# Patient Record
Sex: Female | Born: 1945 | Race: White | Hispanic: No | Smoking: Former smoker
Health system: Southern US, Community
[De-identification: ages and names within clinical notes are randomized; demographics above are authoritative.]

## PROBLEM LIST (undated history)

## (undated) DIAGNOSIS — E7800 Pure hypercholesterolemia, unspecified: Secondary | ICD-10-CM

## (undated) DIAGNOSIS — R350 Frequency of micturition: Secondary | ICD-10-CM

## (undated) DIAGNOSIS — M797 Fibromyalgia: Secondary | ICD-10-CM

## (undated) DIAGNOSIS — Z923 Personal history of irradiation: Secondary | ICD-10-CM

## (undated) DIAGNOSIS — K219 Gastro-esophageal reflux disease without esophagitis: Secondary | ICD-10-CM

## (undated) DIAGNOSIS — E119 Type 2 diabetes mellitus without complications: Secondary | ICD-10-CM

## (undated) DIAGNOSIS — F419 Anxiety disorder, unspecified: Secondary | ICD-10-CM

## (undated) DIAGNOSIS — C50919 Malignant neoplasm of unspecified site of unspecified female breast: Secondary | ICD-10-CM

## (undated) DIAGNOSIS — G4730 Sleep apnea, unspecified: Secondary | ICD-10-CM

## (undated) DIAGNOSIS — I10 Essential (primary) hypertension: Secondary | ICD-10-CM

## (undated) DIAGNOSIS — M7630 Iliotibial band syndrome, unspecified leg: Secondary | ICD-10-CM

## (undated) HISTORY — DX: Gastro-esophageal reflux disease without esophagitis: K21.9

## (undated) HISTORY — DX: Sleep apnea, unspecified: G47.30

## (undated) HISTORY — DX: Malignant neoplasm of unspecified site of unspecified female breast: C50.919

## (undated) HISTORY — DX: Frequency of micturition: R35.0

## (undated) HISTORY — DX: Essential (primary) hypertension: I10

## (undated) HISTORY — DX: Iliotibial band syndrome, unspecified leg: M76.30

## (undated) HISTORY — PX: OTHER SURGICAL HISTORY: SHX169

## (undated) HISTORY — DX: Pure hypercholesterolemia, unspecified: E78.00

## (undated) HISTORY — DX: Type 2 diabetes mellitus without complications: E11.9

## (undated) HISTORY — DX: Anxiety disorder, unspecified: F41.9

## (undated) HISTORY — PX: CARPAL TUNNEL RELEASE: SHX101

## (undated) HISTORY — DX: Fibromyalgia: M79.7

## (undated) NOTE — *Deleted (*Deleted)
Patient Care Team: Lauro Regulus, MD as PCP - General (Internal Medicine)  DIAGNOSIS: No diagnosis found.  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  Progesterone receptor positive.  HER-2/neu receptor negative by FISH. 2. Status post right partial Mastectomy.   3. Oncotype DX score is 16 week chances of recurrent disease in 10 years is 10% with tamoxifen 4. Patient will start radiation therapy followed by an aromatase inhibitor. 5. Started Letrozole, October 18, 2013   Carcinoma of overlapping sites of right breast in female, estrogen receptor positive (HCC)  05/13/2013 Initial Biopsy   Right breast biopsy: Invasive mammary cancer, stage Ic, T1 cN0, ER positive PR positive HER-2 negative, status post right mastectomy, Oncotype DX score 16: 10% RRR with tamoxifen, adjuvant radiation therapy [facility]- seen by Dr.Choksi)   10/18/2013 -  Anti-estrogen oral therapy   Letrozole 2.5 mg daily stopped April 2017.  Restarted July 2019     CHIEF COMPLIANT: Follow-up of right breast cancer on letrozole  INTERVAL HISTORY: Gabriella [name] is a 59 y.o. with above-mentioned history of right breast cancer treated with lumpectomy, radiation, and who is currently on letrozole. She presents to the clinic today for annual follow-up.   ALLERGIES:  is allergic to biaxin [clarithromycin], codeine, eggs or egg-derived products, keflex [cephalexin], morphine and related, penicillins, and sulfa antibiotics.  MEDICATIONS:  Current Outpatient Medications  Medication Sig Dispense Refill  . amitriptyline (ELAVIL) 50 MG tablet Take 1 tablet (50 mg total) by mouth at bedtime. 30 tablet 0  . letrozole (FEMARA) 2.5 MG tablet TAKE ONE TABLET BY MOUTH DAILY 90 tablet 0  . metFORMIN (GLUCOPHAGE) 500 MG tablet Take 500 mg by mouth 2 (two) times  daily with a meal.    . metoprolol tartrate (LOPRESSOR) 25 MG tablet Take 25 mg by mouth daily.    . mometasone (NASONEX) 50 MCG/ACT nasal spray Place 2 sprays into the nose daily.    . pravastatin (PRAVACHOL) 20 MG tablet Take 20 mg by mouth daily.     No current facility-administered medications for this visit.    PHYSICAL EXAMINATION: ECOG PERFORMANCE STATUS: {CHL ONC ECOG [id number]}  There were no vitals filed for this visit. There were no vitals filed for this visit.  BREAST:*** No palpable masses or nodules in either right or left breasts. No palpable axillary supraclavicular or infraclavicular adenopathy no breast tenderness or nipple discharge. (exam performed in the presence of a chaperone)  LABORATORY DATA:  I have reviewed the data as listed CMP Latest Ref Rng & Units 07/09/2019 07/06/2015 01/24/2014  Glucose 65 - 99 mg/dL - 409(W) 119(J)  BUN 6 - 20 mg/dL - 9 13  Creatinine 4.78 - 1.00 mg/dL 2.95 6.21 3.08  Sodium 135 - 145 mmol/L - 133(L) 139  Potassium 3.5 - 5.1 mmol/L - 3.3(L) 3.9  Chloride 101 - 111 mmol/L - 99(L) 102  CO2 22 - 32 mmol/L - 29 30  Calcium 8.9 - 10.3 mg/dL - 9.2 9.0  Total Protein 6.5 - 8.1 g/dL - 7.6 -  Total Bilirubin 0.3 - 1.2 mg/dL - 0.5 -  Alkaline Phos 38 - 126 U/L - 92 -  AST 15 - 41 U/L - 18 -  ALT 14 - 54 U/L - 15 -    Lab Results  Component Value Date   WBC 11.7 (H) 07/06/2015  HGB 12.6 07/06/2015   HCT 37.4 07/06/2015   MCV 86.0 07/06/2015   PLT 348 07/06/2015   NEUTROABS 6.8 (H) 07/06/2015    ASSESSMENT & PLAN:  No problem-specific Assessment & Plan notes found for this encounter.    No orders of the defined types were placed in this encounter.  The patient has a good understanding of the overall plan. she agrees with it. she will call with any problems that may develop before the next visit here.  Total time spent: *** mins including face to face time and time spent for planning, charting and coordination of care   Serena Croissant, MD 04/04/2020  I, Kirt Boys Dorshimer, am acting as scribe for Dr. Serena Croissant.  {insert scribe attestation}

---

## 1997-06-24 HISTORY — PX: OTHER SURGICAL HISTORY: SHX169

## 1997-06-24 HISTORY — PX: CHOLECYSTECTOMY: SHX55

## 1998-06-24 HISTORY — PX: HERNIA REPAIR: SHX51

## 2003-05-26 ENCOUNTER — Ambulatory Visit (HOSPITAL_COMMUNITY): Admission: RE | Admit: 2003-05-26 | Discharge: 2003-05-27 | Payer: Self-pay | Admitting: Ophthalmology

## 2006-05-22 ENCOUNTER — Ambulatory Visit: Payer: Self-pay | Admitting: Obstetrics and Gynecology

## 2007-06-08 ENCOUNTER — Ambulatory Visit: Payer: Self-pay | Admitting: Obstetrics and Gynecology

## 2009-04-24 ENCOUNTER — Ambulatory Visit: Payer: Self-pay | Admitting: Cardiology

## 2009-04-24 ENCOUNTER — Ambulatory Visit: Payer: Self-pay | Admitting: Unknown Physician Specialty

## 2009-05-01 ENCOUNTER — Ambulatory Visit: Payer: Self-pay | Admitting: Family Medicine

## 2009-05-09 ENCOUNTER — Ambulatory Visit: Payer: Self-pay | Admitting: Unknown Physician Specialty

## 2009-05-23 ENCOUNTER — Ambulatory Visit: Payer: Self-pay | Admitting: Unknown Physician Specialty

## 2010-08-28 ENCOUNTER — Ambulatory Visit: Payer: Self-pay | Admitting: Unknown Physician Specialty

## 2011-03-17 ENCOUNTER — Emergency Department: Payer: Self-pay | Admitting: Internal Medicine

## 2012-06-24 DIAGNOSIS — C50919 Malignant neoplasm of unspecified site of unspecified female breast: Secondary | ICD-10-CM

## 2012-06-24 DIAGNOSIS — Z923 Personal history of irradiation: Secondary | ICD-10-CM

## 2012-06-24 HISTORY — PX: BREAST LUMPECTOMY: SHX2

## 2012-06-24 HISTORY — PX: CATARACT EXTRACTION: SUR2

## 2012-06-24 HISTORY — DX: Malignant neoplasm of unspecified site of unspecified female breast: C50.919

## 2012-06-24 HISTORY — DX: Personal history of irradiation: Z92.3

## 2013-03-18 LAB — HM PAP SMEAR: HM Pap smear: NEGATIVE

## 2013-05-04 ENCOUNTER — Ambulatory Visit: Payer: Self-pay | Admitting: Obstetrics and Gynecology

## 2013-05-04 LAB — HM DEXA SCAN

## 2013-05-13 ENCOUNTER — Ambulatory Visit: Payer: Self-pay | Admitting: Obstetrics and Gynecology

## 2013-05-19 LAB — PATHOLOGY REPORT

## 2013-05-27 ENCOUNTER — Ambulatory Visit: Payer: Self-pay | Admitting: Surgery

## 2013-05-27 LAB — CBC WITH DIFFERENTIAL/PLATELET
Basophil #: 0 10*3/uL (ref 0.0–0.1)
Basophil %: 0.5 %
Eosinophil #: 0.2 10*3/uL (ref 0.0–0.7)
Eosinophil %: 2.2 %
HCT: 34.7 % — ABNORMAL LOW (ref 35.0–47.0)
HGB: 11.7 g/dL — ABNORMAL LOW (ref 12.0–16.0)
Lymphocyte #: 3.6 10*3/uL (ref 1.0–3.6)
Lymphocyte %: 38.5 %
MCH: 29.2 pg (ref 26.0–34.0)
MCHC: 33.8 g/dL (ref 32.0–36.0)
MCV: 87 fL (ref 80–100)
Monocyte #: 0.8 x10 3/mm (ref 0.2–0.9)
Monocyte %: 8.9 %
Neutrophil #: 4.7 10*3/uL (ref 1.4–6.5)
Neutrophil %: 49.9 %
Platelet: 301 10*3/uL (ref 150–440)
RBC: 4.01 10*6/uL (ref 3.80–5.20)
RDW: 14.2 % (ref 11.5–14.5)
WBC: 9.4 10*3/uL (ref 3.6–11.0)

## 2013-06-01 ENCOUNTER — Ambulatory Visit: Payer: Self-pay | Admitting: Oncology

## 2013-06-03 ENCOUNTER — Ambulatory Visit: Payer: Self-pay | Admitting: Surgery

## 2013-06-09 LAB — PATHOLOGY REPORT

## 2013-06-24 ENCOUNTER — Ambulatory Visit: Payer: Self-pay | Admitting: Oncology

## 2013-06-25 ENCOUNTER — Ambulatory Visit: Payer: Self-pay | Admitting: Oncology

## 2013-07-25 ENCOUNTER — Ambulatory Visit: Payer: Self-pay | Admitting: Oncology

## 2013-08-09 LAB — CBC CANCER CENTER
Basophil #: 0.1 x10 3/mm (ref 0.0–0.1)
Basophil %: 1 %
Eosinophil #: 0.2 x10 3/mm (ref 0.0–0.7)
Eosinophil %: 2.3 %
HCT: 35.9 % (ref 35.0–47.0)
HGB: 11.9 g/dL — ABNORMAL LOW (ref 12.0–16.0)
Lymphocyte #: 2.4 x10 3/mm (ref 1.0–3.6)
Lymphocyte %: 30.3 %
MCH: 29 pg (ref 26.0–34.0)
MCHC: 33.1 g/dL (ref 32.0–36.0)
MCV: 88 fL (ref 80–100)
Monocyte #: 0.8 x10 3/mm (ref 0.2–0.9)
Monocyte %: 10.3 %
Neutrophil #: 4.4 x10 3/mm (ref 1.4–6.5)
Neutrophil %: 56.1 %
Platelet: 318 x10 3/mm (ref 150–440)
RBC: 4.09 10*6/uL (ref 3.80–5.20)
RDW: 13.9 % (ref 11.5–14.5)
WBC: 7.8 x10 3/mm (ref 3.6–11.0)

## 2013-08-16 LAB — CBC CANCER CENTER
Basophil #: 0.1 x10 3/mm (ref 0.0–0.1)
Basophil %: 0.9 %
Eosinophil #: 0.2 x10 3/mm (ref 0.0–0.7)
Eosinophil %: 2.3 %
HCT: 36 % (ref 35.0–47.0)
HGB: 12 g/dL (ref 12.0–16.0)
Lymphocyte #: 2.3 x10 3/mm (ref 1.0–3.6)
Lymphocyte %: 29.2 %
MCH: 29 pg (ref 26.0–34.0)
MCHC: 33.2 g/dL (ref 32.0–36.0)
MCV: 87 fL (ref 80–100)
Monocyte #: 0.8 x10 3/mm (ref 0.2–0.9)
Monocyte %: 10.1 %
Neutrophil #: 4.5 x10 3/mm (ref 1.4–6.5)
Neutrophil %: 57.5 %
Platelet: 307 x10 3/mm (ref 150–440)
RBC: 4.12 10*6/uL (ref 3.80–5.20)
RDW: 13.7 % (ref 11.5–14.5)
WBC: 7.9 x10 3/mm (ref 3.6–11.0)

## 2013-08-22 ENCOUNTER — Ambulatory Visit: Payer: Self-pay | Admitting: Oncology

## 2013-08-23 LAB — CBC CANCER CENTER
Basophil #: 0.1 x10 3/mm (ref 0.0–0.1)
Basophil %: 0.9 %
Eosinophil #: 0.2 x10 3/mm (ref 0.0–0.7)
Eosinophil %: 3.4 %
HCT: 36.5 % (ref 35.0–47.0)
HGB: 11.9 g/dL — ABNORMAL LOW (ref 12.0–16.0)
Lymphocyte #: 2.5 x10 3/mm (ref 1.0–3.6)
Lymphocyte %: 34.3 %
MCH: 28.6 pg (ref 26.0–34.0)
MCHC: 32.6 g/dL (ref 32.0–36.0)
MCV: 88 fL (ref 80–100)
Monocyte #: 0.8 x10 3/mm (ref 0.2–0.9)
Monocyte %: 10.6 %
Neutrophil #: 3.7 x10 3/mm (ref 1.4–6.5)
Neutrophil %: 50.8 %
Platelet: 309 x10 3/mm (ref 150–440)
RBC: 4.16 10*6/uL (ref 3.80–5.20)
RDW: 14.3 % (ref 11.5–14.5)
WBC: 7.3 x10 3/mm (ref 3.6–11.0)

## 2013-08-31 LAB — CBC CANCER CENTER
Basophil #: 0.1 x10 3/mm (ref 0.0–0.1)
Basophil %: 0.9 %
Eosinophil #: 0.2 x10 3/mm (ref 0.0–0.7)
Eosinophil %: 2.5 %
HCT: 35.7 % (ref 35.0–47.0)
HGB: 11.8 g/dL — ABNORMAL LOW (ref 12.0–16.0)
Lymphocyte #: 2.2 x10 3/mm (ref 1.0–3.6)
Lymphocyte %: 25.2 %
MCH: 28.9 pg (ref 26.0–34.0)
MCHC: 33 g/dL (ref 32.0–36.0)
MCV: 88 fL (ref 80–100)
Monocyte #: 0.9 x10 3/mm (ref 0.2–0.9)
Monocyte %: 10.6 %
Neutrophil #: 5.2 x10 3/mm (ref 1.4–6.5)
Neutrophil %: 60.8 %
Platelet: 277 x10 3/mm (ref 150–440)
RBC: 4.07 10*6/uL (ref 3.80–5.20)
RDW: 14 % (ref 11.5–14.5)
WBC: 8.6 x10 3/mm (ref 3.6–11.0)

## 2013-09-06 LAB — CBC CANCER CENTER
Basophil #: 0.1 x10 3/mm (ref 0.0–0.1)
Basophil %: 1.1 %
Eosinophil #: 0.2 x10 3/mm (ref 0.0–0.7)
Eosinophil %: 3 %
HCT: 35.7 % (ref 35.0–47.0)
HGB: 11.7 g/dL — ABNORMAL LOW (ref 12.0–16.0)
Lymphocyte #: 1.8 x10 3/mm (ref 1.0–3.6)
Lymphocyte %: 27.2 %
MCH: 28.8 pg (ref 26.0–34.0)
MCHC: 32.9 g/dL (ref 32.0–36.0)
MCV: 88 fL (ref 80–100)
Monocyte #: 0.8 x10 3/mm (ref 0.2–0.9)
Monocyte %: 12.4 %
Neutrophil #: 3.8 x10 3/mm (ref 1.4–6.5)
Neutrophil %: 56.3 %
Platelet: 283 x10 3/mm (ref 150–440)
RBC: 4.07 10*6/uL (ref 3.80–5.20)
RDW: 14.2 % (ref 11.5–14.5)
WBC: 6.8 x10 3/mm (ref 3.6–11.0)

## 2013-09-13 LAB — CBC CANCER CENTER
Basophil #: 0.1 x10 3/mm (ref 0.0–0.1)
Basophil %: 0.9 %
Eosinophil #: 0.2 x10 3/mm (ref 0.0–0.7)
Eosinophil %: 2.4 %
HCT: 35 % (ref 35.0–47.0)
HGB: 11.6 g/dL — ABNORMAL LOW (ref 12.0–16.0)
Lymphocyte #: 1.9 x10 3/mm (ref 1.0–3.6)
Lymphocyte %: 24 %
MCH: 28.9 pg (ref 26.0–34.0)
MCHC: 33.1 g/dL (ref 32.0–36.0)
MCV: 87 fL (ref 80–100)
Monocyte #: 0.8 x10 3/mm (ref 0.2–0.9)
Monocyte %: 10 %
Neutrophil #: 4.9 x10 3/mm (ref 1.4–6.5)
Neutrophil %: 62.7 %
Platelet: 283 x10 3/mm (ref 150–440)
RBC: 4.01 10*6/uL (ref 3.80–5.20)
RDW: 14.1 % (ref 11.5–14.5)
WBC: 7.7 x10 3/mm (ref 3.6–11.0)

## 2013-09-22 ENCOUNTER — Ambulatory Visit: Payer: Self-pay | Admitting: Oncology

## 2013-10-18 LAB — CBC CANCER CENTER
Basophil #: 0.1 x10 3/mm (ref 0.0–0.1)
Basophil %: 0.8 %
Eosinophil #: 0.2 x10 3/mm (ref 0.0–0.7)
Eosinophil %: 2.6 %
HCT: 36.7 % (ref 35.0–47.0)
HGB: 12.2 g/dL (ref 12.0–16.0)
Lymphocyte #: 2.1 x10 3/mm (ref 1.0–3.6)
Lymphocyte %: 27.7 %
MCH: 29.2 pg (ref 26.0–34.0)
MCHC: 33.4 g/dL (ref 32.0–36.0)
MCV: 88 fL (ref 80–100)
Monocyte #: 0.8 x10 3/mm (ref 0.2–0.9)
Monocyte %: 10.3 %
Neutrophil #: 4.4 x10 3/mm (ref 1.4–6.5)
Neutrophil %: 58.6 %
Platelet: 319 x10 3/mm (ref 150–440)
RBC: 4.19 10*6/uL (ref 3.80–5.20)
RDW: 13.9 % (ref 11.5–14.5)
WBC: 7.4 x10 3/mm (ref 3.6–11.0)

## 2013-10-18 LAB — COMPREHENSIVE METABOLIC PANEL
Albumin: 3.3 g/dL — ABNORMAL LOW (ref 3.4–5.0)
Alkaline Phosphatase: 102 U/L
Anion Gap: 10 (ref 7–16)
BUN: 11 mg/dL (ref 7–18)
Bilirubin,Total: 0.3 mg/dL (ref 0.2–1.0)
Calcium, Total: 9.8 mg/dL (ref 8.5–10.1)
Chloride: 103 mmol/L (ref 98–107)
Co2: 29 mmol/L (ref 21–32)
Creatinine: 0.89 mg/dL (ref 0.60–1.30)
EGFR (African American): >60
EGFR (Non-African Amer.): >60
Glucose: 155 mg/dL — ABNORMAL HIGH (ref 65–99)
Osmolality: 286 (ref 275–301)
Potassium: 4.3 mmol/L (ref 3.5–5.1)
SGOT(AST): 20 U/L (ref 15–37)
SGPT (ALT): 25 U/L (ref 12–78)
Sodium: 142 mmol/L (ref 136–145)
Total Protein: 7.5 g/dL (ref 6.4–8.2)

## 2013-10-22 ENCOUNTER — Ambulatory Visit: Payer: Self-pay | Admitting: Oncology

## 2013-11-24 ENCOUNTER — Ambulatory Visit: Payer: Self-pay | Admitting: Oncology

## 2014-01-24 ENCOUNTER — Ambulatory Visit: Payer: Self-pay | Admitting: Oncology

## 2014-01-24 LAB — CBC CANCER CENTER
Basophil #: 0.1 x10 3/mm (ref 0.0–0.1)
Basophil %: 0.7 %
Eosinophil #: 0.1 x10 3/mm (ref 0.0–0.7)
Eosinophil %: 1.7 %
HCT: 35.1 % (ref 35.0–47.0)
HGB: 11.7 g/dL — ABNORMAL LOW (ref 12.0–16.0)
Lymphocyte #: 1.9 x10 3/mm (ref 1.0–3.6)
Lymphocyte %: 23.4 %
MCH: 29.6 pg (ref 26.0–34.0)
MCHC: 33.3 g/dL (ref 32.0–36.0)
MCV: 89 fL (ref 80–100)
Monocyte #: 0.7 x10 3/mm (ref 0.2–0.9)
Monocyte %: 8.9 %
Neutrophil #: 5.2 x10 3/mm (ref 1.4–6.5)
Neutrophil %: 65.3 %
Platelet: 279 x10 3/mm (ref 150–440)
RBC: 3.96 10*6/uL (ref 3.80–5.20)
RDW: 13.7 % (ref 11.5–14.5)
WBC: 7.9 x10 3/mm (ref 3.6–11.0)

## 2014-01-24 LAB — BASIC METABOLIC PANEL
Anion Gap: 7 (ref 7–16)
BUN: 13 mg/dL (ref 7–18)
Calcium, Total: 9 mg/dL (ref 8.5–10.1)
Chloride: 102 mmol/L (ref 98–107)
Co2: 30 mmol/L (ref 21–32)
Creatinine: 0.98 mg/dL (ref 0.60–1.30)
EGFR (African American): >60
EGFR (Non-African Amer.): 59 — ABNORMAL LOW
Glucose: 146 mg/dL — ABNORMAL HIGH (ref 65–99)
Osmolality: 280 (ref 275–301)
Potassium: 3.9 mmol/L (ref 3.5–5.1)
Sodium: 139 mmol/L (ref 136–145)

## 2014-02-22 ENCOUNTER — Ambulatory Visit: Payer: Self-pay | Admitting: Oncology

## 2014-05-27 ENCOUNTER — Ambulatory Visit: Payer: Self-pay | Admitting: Oncology

## 2014-06-25 IMAGING — MG MAM BREAST SPECIMEN
1 series · 1 of 1 positions shown · non-contrast
Comparison: Previous exams.

CLINICAL DATA: Patient for right breast lumpectomy.

EXAM:
NEEDLE LOCALIZATION OF THE RIGHT BREAST WITH MAMMO GUIDANCE

[R CC]
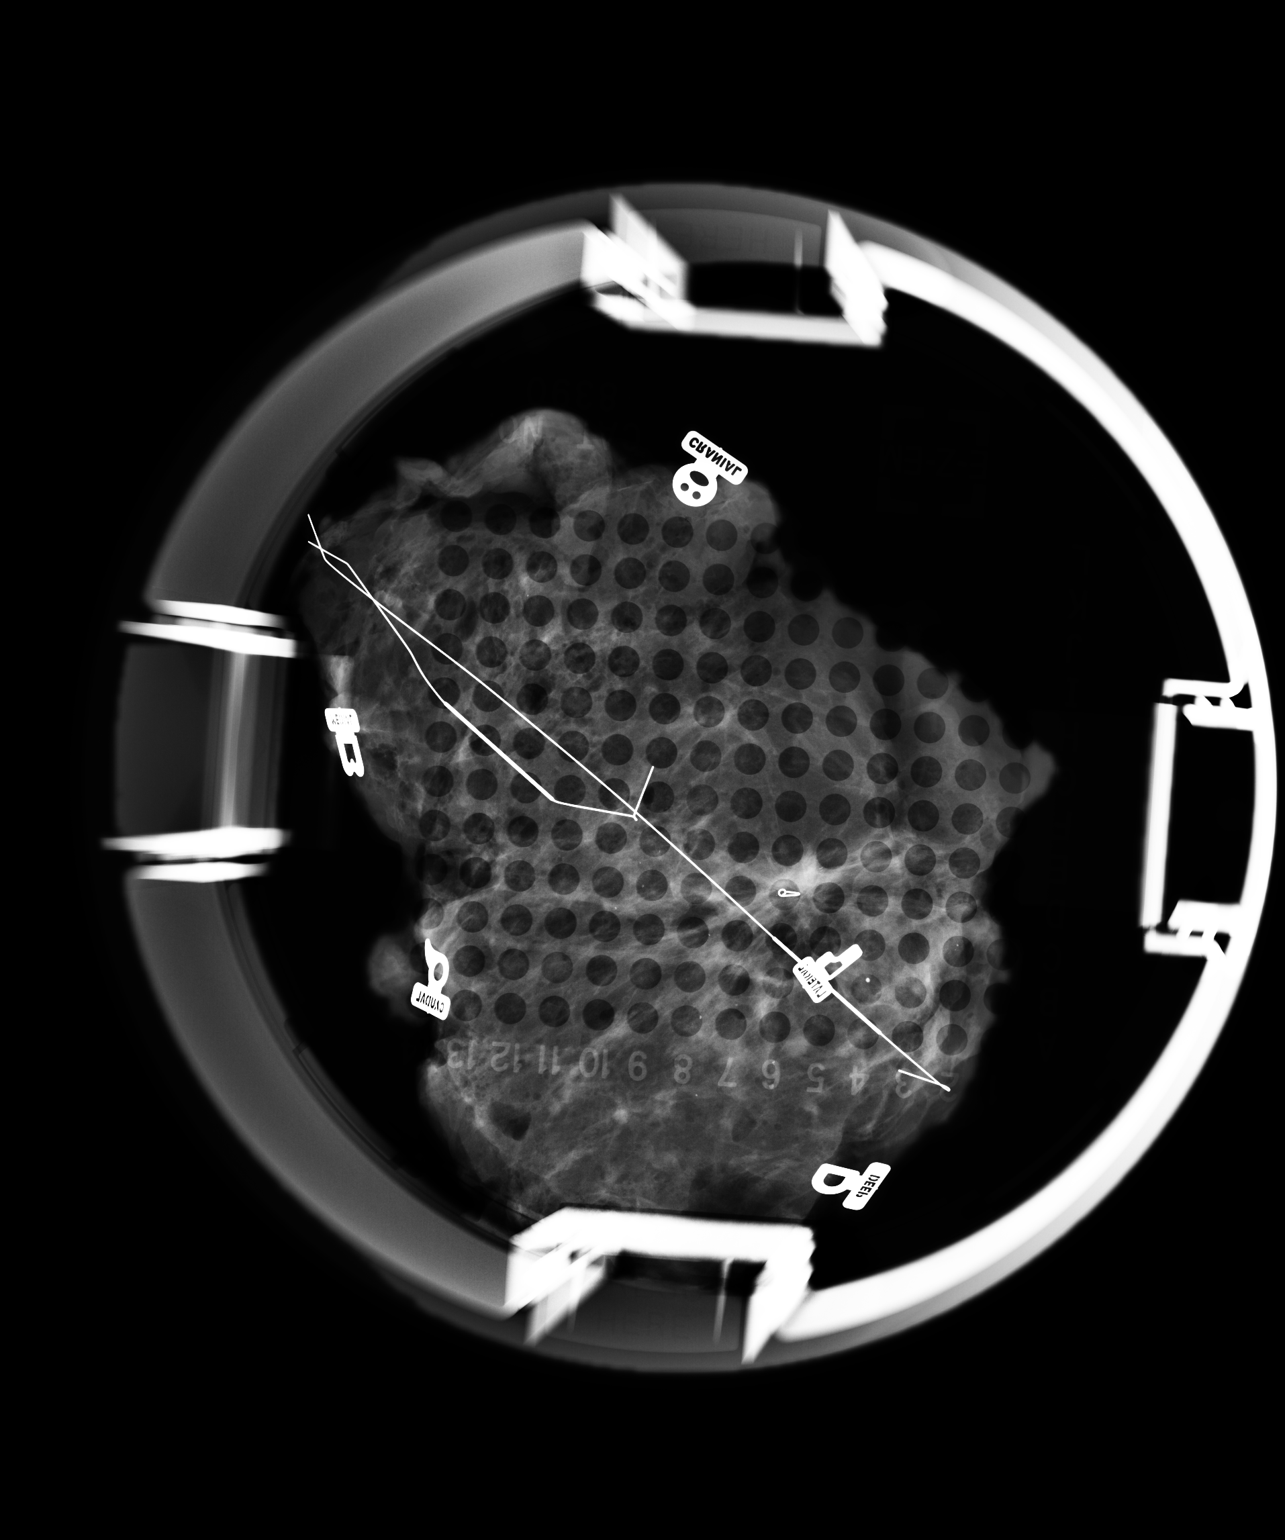

[1 of 1 positions shown; findings below may reference images not displayed]

FINDINGS: Patient presents for needle localization prior to right breast
lumpectomy. I met with the patient and we discussed the procedure of
needle localization including benefits and alternatives. We
discussed the high likelihood of a successful procedure. We
discussed the risks of the procedure, including infection, bleeding,
tissue injury, and further surgery. Informed, written consent was
given. The usual time-out protocol was performed immediately prior
to the procedure.

Using mammographic guidance, sterile technique, 2% lidocaine and a 9
cm modified Kopans needle, the spiculated right breast mass was
localized using a medial approach. The films were marked for Dr.
Mehmed Ve.

Specimen radiograph was performed at [HOSPITAL] and confirms
biopsy marking clip, mass and distal aspect of 2 Motta wires
present in the tissue sample. The specimen was marked for pathology.
IMPRESSION: Needle localization right breast. No apparent complications.

## 2014-06-29 ENCOUNTER — Ambulatory Visit: Payer: Self-pay | Admitting: Oncology

## 2014-08-25 ENCOUNTER — Ambulatory Visit
Admit: 2014-08-25 | Disposition: A | Discharge status: Home / Self Care | Payer: Self-pay | Attending: Oncology | Admitting: Oncology

## 2014-09-02 ENCOUNTER — Ambulatory Visit: Payer: Self-pay | Admitting: Oncology

## 2014-09-23 ENCOUNTER — Ambulatory Visit
Admit: 2014-09-23 | Disposition: A | Discharge status: Home / Self Care | Payer: Self-pay | Attending: Oncology | Admitting: Oncology

## 2014-10-14 NOTE — Op Note (Signed)
PATIENT NAME:  Gabriella [name], DAFOE MR#:  [id number] DATE OF BIRTH:  [birth year]  DATE OF PROCEDURE:  06/03/2013  PREOPERATIVE DIAGNOSIS:  Carcinoma of the right breast.   POSTOPERATIVE DIAGNOSIS:  Carcinoma of the right breast.   PROCEDURE:  Right partial mastectomy with axillary sentinel lymph node biopsy.   SURGEON:  Loreli Dollar, M.D.   ANESTHESIA:  General.   INDICATION:  This 69 year old female recently had a finding of mass in the upper inner quadrant of the right breast. She had ultrasound-guided core biopsy. It demonstrated infiltrating mammary carcinoma. She had preoperative insertion of Copan's wire and also preoperative injection of radioactive technetium sulfur colloid.   DESCRIPTION OF PROCEDURE:  The patient was placed on the operating table in the supine position under general anesthesia. The dressing was removed from the right breast exposing two Copan's wires, which entered the breast at approximately 2 o'clock position. The two wires were cut 2 cm from the skin. It was noted that only the deeper wire was marking the tumor. The right arm was placed on a lateral arm rest. The breast, axilla and chest wall were prepared with ChloraPrep and draped in a sterile manner.   The gamma counter was used to probe the axilla and noted an area of radioactivity in the inferior aspect of the axilla. An oblique incision was made some 5 cm in length, carried down through subcutaneous tissues. Several small bleeding points were cauterized. One traversing vein was divided between 4-0 chromic ligatures. Dissection was carried down through superficial fascia down deeply within the breast and encountering radioactivity in a lymph node adjacent to the chest wall. This lymph node was dissected free from surrounding structures. It was about a centimeter in size, was soft. The ex vivo count was greater than 3500 counts per second. The background count did have some additional radioactivity and encountered  another lymph node somewhat deeper, which was dissected free from surrounding structures and was about 7 mm and was removed using electrocautery for hemostasis. The counts per second were between 350 and 400. This was submitted as sentinel lymph node #2. Both nodes sent for a routine pathology. There was no remaining palpable mass within the axilla and the background count was less than 7 counts per second. The wound was inspected. Hemostasis was intact.   Next, attention was turned to do the right partial mastectomy, noting again the presence of the wires at the 2 o'clock position, peripherally located. An incision was made, which was approximately 9 cm from the nipple, from approximately 12 o'clock position to 3 o'clock position and carried down through the subcutaneous tissues. A narrow ellipse of skin was removed with the specimen and dissected down to encounter the wires and dissected out a portion of tissue surrounding the deeper wire, palpating during the course of dissection, and removed a fairly large sample of tissue, which was oriented with the margin maps to mark the medial, lateral, cranial, caudal, and deep margins by suturing the markers to the specimen and was submitted for specimen mammogram and pathology. The wound was inspected. One clamped vessel was ligated with a 4-0 chromic. A number of small bleeding points were cauterized.   Attention was turned back to the axillary wound. Hemostasis was intact. The skin was closed with running 4-0 Monocryl subcuticular suture.   Next, attention was turned back to the partial mastectomy wound, which was again inspected and found hemostasis was intact. The subcutaneous tissues were closed with interrupted 3-0 chromic, and the  skin was closed with running 4-0 Monocryl subcuticular suture.  Subsequently, the pathologist did call back to report that the tumor was found with the closest margin was inferior and with some 5 mm and appeared to be adequate.    The two wounds were treated with Dermabond. The patient appeared to be in satisfactory condition and was prepared for transfer to the recovery room.   ____________________________ Lenna Sciara. Rochel Brome, MD [email or web address] D: 06/03/2013 16:17:54 ET T: 06/03/2013 17:00:36 ET JOB#: [id number]  cc: Loreli Dollar, MD, <Dictator> Loreli Dollar MD ELECTRONICALLY SIGNED 06/04/2013 18:53

## 2014-10-15 NOTE — Consult Note (Signed)
Reason for Visit: This 69 year old Female Gabriella [name] presents to the clinic for initial evaluation of  breast cancer .   Referred by Dr. Oliva Bustard.  Diagnosis:  Chief Complaint/Diagnosis   69 year old female status post wide local excision and sentinel node biopsy of the right breast for stage IC (T1 C. N0 M0) ER/PR positive HER-2/neu negative invasive mammary carcinoma Oncotype DX score of 16 for adjuvant radiation therapy as well as aromatase inhibitor.  Pathology Report pathology report reviewed   Imaging Report mammograms and ultrasound reviewed   Referral Report clinical notes reviewed   Planned Treatment Regimen whole breast radiation plus aromatase inhibitor   HPI   Gabriella [name] is a pleasant 69 year old female who presented with a self discovered mass in the right breast. Mammogram was performed showing 31mm mass in the 1:30 position the right breast highly suspicious for invasive cancer. She underwent a needle localization positive for ER PR positive strongly HER-2/neu negative invasive mammary carcinoma. Went on to have a wide local excision and sentinel node biopsy for a 1.5 cm grade 1 invasive mammary carcinoma. 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 Margins were clear at 3 mm for the invasive component and also clear for DCIS. 2 sentinel lymph nodes were negative. Gabriella [name] was seen by medical oncology and Oncotype DX was performed showing a recurrence risk of 16. She will not receive systemic chemotherapy. She is seen today for opinion regarding adjuvant radiation therapy. She's doing well still has a small area of granulation tissue in the middle of her scar otherwise no complaints. Specifically denies breast tenderness cough or bone pain.  Past Hx:    diabetes:    fibromyalgia:    detached hernia:    cataract:    hernia:    uterine polyps:    bladder surg:   Past, Family and Social History:  Past Medical History positive   Endocrine diabetes mellitus   Past Surgical  History bladder sling, uterine polyps, herniorrhaphy repair, cataract surgery,   Past Medical History Comments fibromyalgia   Family History positive   Family History Comments mother with ovarian cancer   Social History noncontributory   Additional Past Medical and Surgical History accompanied by her husband today   Allergies:   Penicillin: Rash, Swelling  Sulfa: N/V/Diarrhea, Rash, Swelling  Cephalexin: Hives  Morphine: N/V/Diarrhea, Tachycardia, Other  Eggs: Hives, Rash  Codeine: Other  Epinephrine: Dizzy/Fainting, Tachycardia  flu vaccines: Hives, Rash  Tape: Rash  Lemon: Unknown  Home Meds:  Home Medications: Medication Instructions Status  Calcium 600+D 1 tab(s) po 2 times a day Active  Femara 2.5 mg oral tablet 1 tab(s) orally once a day Active  citalopram 20 mg oral tablet 1 tab(s) orally every other day (at bedtime) Active  omeprazole 20 mg oral delayed release capsule 1 cap(s) orally once a day, As Needed Active  pravastatin 20 mg oral tablet 1 tab(s) orally once a day (at bedtime) Active  amitriptyline 50 mg oral tablet 1 tab(s) orally once a day (at bedtime) Active  metoprolol succinate 25 mg oral tablet, extended release 1 tab(s) orally once a day (at bedtime) Active  metFORMIN extended release 500 mg oral tablet, extended release 1 tab(s) orally 2 times a day Active  Nasonex 50 mcg/inh nasal spray 2 spray(s) each nostril once a day (at bedtime) Active  Arthrotec 75 mg-200 mcg oral tablet 1 tab(s) orally 2 times a day, As Needed Active  Vitamin B-12  injectable 1x per month Active  Vitamin B12 1000 mcg  oral tablet 1 tab(s) orally once a day (at bedtime) Active   Review of Systems:  General negative   Performance Status (ECOG) 0   Skin negative   Breast see HPI   Ophthalmologic negative   ENMT negative   Respiratory and Thorax negative   Cardiovascular negative   Gastrointestinal negative   Genitourinary negative   Musculoskeletal negative    Neurological negative   Psychiatric negative   Hematology/Lymphatics negative   Endocrine negative   Allergic/Immunologic negative   Review of Systems   review of systems obtained from nurses notes  Nursing Notes:  Nursing Vital Signs and Chemo Nursing Nursing Notes: *CC Vital Signs Flowsheet:   21-Jan-15 09:Gabriella  Temp Temperature 97.3  Pulse Pulse 70  Respirations Respirations 20  SBP SBP 130  DBP DBP 68  Current Weight (kg) (kg) 101.7  Height (cm) centimeters 157  BSA (m2) 2   Physical Exam:  General/Skin/HEENT:  General normal   Skin normal   Eyes normal   ENMT normal   Head and Neck normal   Additional PE well-developed slightly obese female in NAD. Right breast shows a wide local excision scar which is healing well with slight area granulate she tissue in the middle of the scar. No dominant mass or nodularity is noted in either breast into position examined. Lungs are clear to A&P cardiac examination shows regular rate and rhythm. Abdomen is benign.   Breasts/Resp/CV/GI/GU:  Respiratory and Thorax normal   Cardiovascular normal   Gastrointestinal normal   Genitourinary normal   MS/Neuro/Psych/Lymph:  Musculoskeletal normal   Neurological normal   Lymphatics normal   Other Results:  Radiology Results: [facility]:    20-Nov-14 10:27, Digital Additional Views Rt Breast (SCR)  Digital Additional Views Rt Breast (SCR)   REASON FOR EXAM:    av rt mass  COMMENTS:       PROCEDURE: MAM - MAM DIG ADDVIEWS RT SCR  - May 13 2013 10:27AM     CLINICAL DATA:  Possible right breast mass at recent screening  mammography.    EXAM:  DIGITAL DIAGNOSTIC  RIGHT MAMMOGRAM    ULTRASOUND RIGHT BREAST    COMPARISON:  Previous examinations, including the screening  mammogram dated 05/13/2013.  ACR Breast Density Category b: There are scattered areas of  fibroglandular density.    FINDINGS:  Spot compression views of the right breast confirm a small mass  with  irregular, spiculated margins deep in the inner portion of the  breast. There are associated tiny microcalcifications.    On physical exam no mass is palpable in the medial right breast or  in the right axilla    Ultrasound is performed, showing an 8 x 8 7 mm irregular,  hypoechoic, vertically oriented solid mass in the 1:30 o'clock  position of the right breast, 10 cm from the nipple. There is mild  surrounding ill-defined increased echogenicity. This corresponds to  the mammographic mass. No abnormal internal mammary or right  axillary lymph nodes were visualized.     IMPRESSION:  8 mm mass in the 1:30 o'clock position of the right breast with  imaging features highly suspicious for invasive mammary carcinoma.  Ultrasound-guided core needle biopsy is recommended and has been  scheduled for 1 p.m. today.    RECOMMENDATION:  Right breast ultrasound-guided core needle biopsy (scheduled for 1  p.m. today).    I have discussed the findings and recommendations with the Gabriella [name].  Results were also provided in writing at the conclusion  of the  visit. If applicable, a reminder letter will be sent to the Gabriella [name]  regarding the next appointment.    BI-RADS CATEGORY  5: Highly suggestive of malignancy - appropriate  action should be taken.      Electronically Signed    By: Enrique Sack M.D.    On: 05/13/2013 11:28         Verified By: Gerald Stabs, M.D.,   Relevent Results:   Relevant Scans and Labs mammograms ultrasound reviewed   Assessment and Plan: Impression:   stage I invasive mammary carcinoma right breast status post wide local excision ER/PR positive HER-2/neu negative for adjuvant radiation therapy plus aromatase inhibitor in 69 year old female Plan:   at this time I recommended going ahead with whole breast radiation. Would not be able to hyperfractionated course based on her large breast size. Would plan on delivering 5000 cGy over 5 weeks and boost to her  scar another 1400 cGy based on her close margin. Risks and benefits of treatment including irritation of the skin, possible alteration of blood counts, inclusive small volume of superficial lung, fatigue, were explained in detail to the Gabriella [name] and her husband. Both seem to comprehend my treatment plan well. I have set her up for CT simulation in about a week's time to allow some further healing of her scar. Gabriella [name] also be a candidate for aromatase inhibitor after completion of radiation.  I would like to take this opportunity to thank you for allowing me to continue to participate in this Gabriella [name]'s care.  CC Referral:  cc: Dr. Tamala Julian, Dr. Frazier Richards   Electronic Signatures: Baruch Gouty, Roda Shutters (MD)  (Signed 21-Jan-15 12:42)  Authored: HPI, Diagnosis, Past Hx, PFSH, Allergies, Home Meds, ROS, Nursing Notes, Physical Exam, Other Results, Relevent Results, Encounter Assessment and Plan, CC Referring Physician   Last Updated: 21-Jan-15 12:42 by Armstead Peaks (MD)

## 2014-12-15 ENCOUNTER — Encounter: Payer: Self-pay | Admitting: Obstetrics and Gynecology

## 2015-01-07 ENCOUNTER — Other Ambulatory Visit: Payer: Self-pay | Admitting: Obstetrics and Gynecology

## 2015-01-12 ENCOUNTER — Encounter: Payer: Self-pay | Admitting: Obstetrics and Gynecology

## 2015-02-01 ENCOUNTER — Other Ambulatory Visit: Payer: Self-pay | Admitting: Oncology

## 2015-02-04 ENCOUNTER — Other Ambulatory Visit: Payer: Self-pay | Admitting: Obstetrics and Gynecology

## 2015-02-09 ENCOUNTER — Encounter: Payer: Self-pay | Admitting: Obstetrics and Gynecology

## 2015-02-22 ENCOUNTER — Other Ambulatory Visit: Payer: Self-pay | Admitting: *Deleted

## 2015-02-22 DIAGNOSIS — C50919 Malignant neoplasm of unspecified site of unspecified female breast: Secondary | ICD-10-CM

## 2015-02-23 ENCOUNTER — Encounter: Payer: Self-pay | Admitting: Obstetrics and Gynecology

## 2015-03-01 ENCOUNTER — Ambulatory Visit: Payer: Self-pay | Admitting: Radiation Oncology

## 2015-03-01 ENCOUNTER — Inpatient Hospital Stay: Payer: Medicare Other | Admitting: Oncology

## 2015-03-01 ENCOUNTER — Inpatient Hospital Stay: Payer: Medicare Other | Attending: Oncology

## 2015-03-20 ENCOUNTER — Inpatient Hospital Stay: Payer: Medicare Other

## 2015-03-20 ENCOUNTER — Inpatient Hospital Stay: Payer: Medicare Other | Admitting: Oncology

## 2015-03-20 ENCOUNTER — Ambulatory Visit: Payer: Medicare Other | Admitting: Radiation Oncology

## 2015-03-27 ENCOUNTER — Ambulatory Visit: Payer: Medicare Other | Attending: Radiation Oncology | Admitting: Radiation Oncology

## 2015-03-29 ENCOUNTER — Telehealth: Payer: Self-pay | Admitting: Obstetrics and Gynecology

## 2015-03-29 NOTE — Telephone Encounter (Signed)
Pt needs her amatripoline refilled. She has an appt to come for visit 10/27  [facility]

## 2015-03-30 MED ORDER — AMITRIPTYLINE HCL 50 MG PO TABS: 50.0000 mg | ORAL_TABLET | Freq: Every day | ORAL | Status: AC

## 2015-03-30 NOTE — Telephone Encounter (Signed)
Pt aware per vm rx erx to [facility].

## 2015-04-17 ENCOUNTER — Inpatient Hospital Stay: Payer: Medicare Other | Admitting: Oncology

## 2015-04-17 ENCOUNTER — Ambulatory Visit: Payer: Medicare Other | Admitting: Oncology

## 2015-04-17 ENCOUNTER — Inpatient Hospital Stay: Payer: Medicare Other

## 2015-04-17 ENCOUNTER — Encounter: Payer: Self-pay | Admitting: *Deleted

## 2015-04-17 NOTE — Progress Notes (Unsigned)
  Oncology Nurse Navigator Documentation    Navigator Encounter Type: Telephone (04/17/15 0800) Patient Visit Type: Follow-up (04/17/15 0800)           Coordination of Care: MD Appointments (rescheduled appt) (04/17/15 0800)        Time Spent with Patient: 15 (04/17/15 0800)

## 2015-04-20 ENCOUNTER — Encounter: Payer: Self-pay | Admitting: Obstetrics and Gynecology

## 2015-04-28 ENCOUNTER — Other Ambulatory Visit: Payer: Self-pay | Admitting: Obstetrics and Gynecology

## 2015-05-08 ENCOUNTER — Ambulatory Visit: Payer: Medicare Other | Admitting: Radiation Oncology

## 2015-05-08 ENCOUNTER — Inpatient Hospital Stay: Payer: Medicare Other

## 2015-05-08 ENCOUNTER — Inpatient Hospital Stay: Payer: Medicare Other | Admitting: Oncology

## 2015-05-12 ENCOUNTER — Telehealth: Payer: Self-pay | Admitting: *Deleted

## 2015-05-12 MED ORDER — LETROZOLE 2.5 MG PO TABS: 2.5000 mg | ORAL_TABLET | Freq: Every day | ORAL | Status: DC

## 2015-05-12 NOTE — Telephone Encounter (Signed)
30 day supply escribed to pharmacy with no refills. Pt needs to keep next appt to be seen to get another refill of medication. Pt aware.

## 2015-05-31 ENCOUNTER — Inpatient Hospital Stay: Payer: Medicare Other | Admitting: Oncology

## 2015-05-31 ENCOUNTER — Ambulatory Visit: Payer: Medicare Other | Admitting: Radiation Oncology

## 2015-05-31 ENCOUNTER — Inpatient Hospital Stay: Payer: Medicare Other | Attending: Oncology

## 2015-06-08 ENCOUNTER — Encounter: Payer: Self-pay | Admitting: Obstetrics and Gynecology

## 2015-06-08 ENCOUNTER — Telehealth: Payer: Self-pay | Admitting: *Deleted

## 2015-06-08 NOTE — Telephone Encounter (Signed)
Patient called and their car is now in the shop needing major repair.  Someone has helped them get another car but it needs a battery and they need help with getting the title transferred.  Patient asked if you would be able to help them once more? She said around $150 should take care of everything.  She said they would pay it back in January if you could.  I told her I would ask you and one of [place] would be back in touch with her.  She said the car will not be available until Saturday.

## 2015-06-09 NOTE — Telephone Encounter (Signed)
[place] - who states patient will need to bring in W-9 and estimated cost of battery.  It may take a couple of weeks before he will be able to help.  Called patient and relayed message from [place].  She understands and will seek help elsewhere due to wanting to get car tomorrow.

## 2015-06-13 ENCOUNTER — Ambulatory Visit: Payer: Self-pay | Admitting: Obstetrics and Gynecology

## 2015-06-14 ENCOUNTER — Other Ambulatory Visit: Payer: Self-pay | Admitting: *Deleted

## 2015-06-14 MED ORDER — LETROZOLE 2.5 MG PO TABS: 2.5000 mg | ORAL_TABLET | Freq: Every day | ORAL | Status: DC

## 2015-06-14 NOTE — Telephone Encounter (Signed)
Called patient to inform her that #30 Letrozole's have been called to pharmacy.  Patient verbalized understanding and said she will see [place] 06-29-15.

## 2015-06-14 NOTE — Telephone Encounter (Signed)
#  30 has been escribed to pharmacy.

## 2015-06-14 NOTE — Addendum Note (Signed)
Addended by: Telford Nab on: 06/14/2015 01:52 PM   Modules accepted: Orders

## 2015-06-14 NOTE — Telephone Encounter (Signed)
Patient had to reschedule her last appointment due to being ill.  She has rescheduled for 1-5-7, however, will be out of her Letrazole this Friday.  Can enough be called to get her through until she is seen in January?

## 2015-06-20 ENCOUNTER — Ambulatory Visit: Payer: Self-pay | Admitting: Certified Nurse Midwife

## 2015-06-28 ENCOUNTER — Ambulatory Visit: Payer: Self-pay | Admitting: Certified Nurse Midwife

## 2015-06-29 ENCOUNTER — Encounter: Payer: Self-pay | Admitting: *Deleted

## 2015-06-29 ENCOUNTER — Inpatient Hospital Stay: Payer: Medicare Other | Admitting: Oncology

## 2015-06-29 ENCOUNTER — Ambulatory Visit: Payer: Medicare Other | Admitting: Radiation Oncology

## 2015-06-29 NOTE — Progress Notes (Signed)
Gabriella [name] will not be continuing follow up with Dr. Baruch Gouty and will only follow up with Dr. Oliva Bustard due to numerous missed and cancelled appointments.

## 2015-07-03 ENCOUNTER — Ambulatory Visit: Payer: Self-pay | Admitting: Certified Nurse Midwife

## 2015-07-06 ENCOUNTER — Encounter: Payer: Self-pay | Admitting: Oncology

## 2015-07-06 ENCOUNTER — Inpatient Hospital Stay: Payer: BLUE CROSS/BLUE SHIELD

## 2015-07-06 ENCOUNTER — Inpatient Hospital Stay: Payer: BLUE CROSS/BLUE SHIELD | Attending: Oncology | Admitting: Oncology

## 2015-07-06 VITALS — BP 135/81 | HR 92 | Temp 97.3°F | Wt 214.7 lb

## 2015-07-06 DIAGNOSIS — K219 Gastro-esophageal reflux disease without esophagitis: Secondary | ICD-10-CM | POA: Insufficient documentation

## 2015-07-06 DIAGNOSIS — Z170 Estrogen receptor positive status [ER+]: Secondary | ICD-10-CM | POA: Insufficient documentation

## 2015-07-06 DIAGNOSIS — M797 Fibromyalgia: Secondary | ICD-10-CM | POA: Diagnosis not present

## 2015-07-06 DIAGNOSIS — Z79899 Other long term (current) drug therapy: Secondary | ICD-10-CM

## 2015-07-06 DIAGNOSIS — C50919 Malignant neoplasm of unspecified site of unspecified female breast: Secondary | ICD-10-CM

## 2015-07-06 DIAGNOSIS — I10 Essential (primary) hypertension: Secondary | ICD-10-CM | POA: Diagnosis not present

## 2015-07-06 DIAGNOSIS — F410 Panic disorder [episodic paroxysmal anxiety] without agoraphobia: Secondary | ICD-10-CM | POA: Diagnosis not present

## 2015-07-06 DIAGNOSIS — Z7984 Long term (current) use of oral hypoglycemic drugs: Secondary | ICD-10-CM | POA: Diagnosis not present

## 2015-07-06 DIAGNOSIS — G4730 Sleep apnea, unspecified: Secondary | ICD-10-CM | POA: Diagnosis not present

## 2015-07-06 DIAGNOSIS — Z79811 Long term (current) use of aromatase inhibitors: Secondary | ICD-10-CM | POA: Insufficient documentation

## 2015-07-06 DIAGNOSIS — Z8041 Family history of malignant neoplasm of ovary: Secondary | ICD-10-CM | POA: Insufficient documentation

## 2015-07-06 DIAGNOSIS — E119 Type 2 diabetes mellitus without complications: Secondary | ICD-10-CM | POA: Diagnosis not present

## 2015-07-06 DIAGNOSIS — E7800 Pure hypercholesterolemia, unspecified: Secondary | ICD-10-CM | POA: Diagnosis not present

## 2015-07-06 DIAGNOSIS — C50911 Malignant neoplasm of unspecified site of right female breast: Secondary | ICD-10-CM | POA: Diagnosis not present

## 2015-07-06 LAB — COMPREHENSIVE METABOLIC PANEL
ALT: 15 U/L (ref 14–54)
AST: 18 U/L (ref 15–41)
Albumin: 3.8 g/dL (ref 3.5–5.0)
Alkaline Phosphatase: 92 U/L (ref 38–126)
Anion gap: 5 (ref 5–15)
BUN: 9 mg/dL (ref 6–20)
CO2: 29 mmol/L (ref 22–32)
Calcium: 9.2 mg/dL (ref 8.9–10.3)
Chloride: 99 mmol/L — ABNORMAL LOW (ref 101–111)
Creatinine, Ser: 0.77 mg/dL (ref 0.44–1.00)
GFR calc Af Amer: >60 mL/min (ref >60)
GFR calc non Af Amer: >60 mL/min (ref >60)
Glucose, Bld: 149 mg/dL — ABNORMAL HIGH (ref 65–99)
Potassium: 3.3 mmol/L — ABNORMAL LOW (ref 3.5–5.1)
Sodium: 133 mmol/L — ABNORMAL LOW (ref 135–145)
Total Bilirubin: 0.5 mg/dL (ref 0.3–1.2)
Total Protein: 7.6 g/dL (ref 6.5–8.1)

## 2015-07-06 LAB — CBC WITH DIFFERENTIAL/PLATELET
Basophils Absolute: 0.1 10*3/uL (ref 0–0.1)
Basophils Relative: 1 %
Eosinophils Absolute: 0.2 10*3/uL (ref 0–0.7)
Eosinophils Relative: 1 %
HCT: 37.4 % (ref 35.0–47.0)
Hemoglobin: 12.6 g/dL (ref 12.0–16.0)
Lymphocytes Relative: 31 %
Lymphs Abs: 3.7 10*3/uL — ABNORMAL HIGH (ref 1.0–3.6)
MCH: 29 pg (ref 26.0–34.0)
MCHC: 33.7 g/dL (ref 32.0–36.0)
MCV: 86 fL (ref 80.0–100.0)
Monocytes Absolute: 1 10*3/uL — ABNORMAL HIGH (ref 0.2–0.9)
Monocytes Relative: 9 %
Neutro Abs: 6.8 10*3/uL — ABNORMAL HIGH (ref 1.4–6.5)
Neutrophils Relative %: 58 %
Platelets: 348 10*3/uL (ref 150–440)
RBC: 4.35 MIL/uL (ref 3.80–5.20)
RDW: 13.9 % (ref 11.5–14.5)
WBC: 11.7 10*3/uL — ABNORMAL HIGH (ref 3.6–11.0)

## 2015-07-06 MED ORDER — LETROZOLE 2.5 MG PO TABS
2.5000 mg | ORAL_TABLET | Freq: Every day | ORAL | Status: DC
Start: 2015-07-06 — End: 2016-03-06

## 2015-07-06 NOTE — Progress Notes (Signed)
[place] @ [facility] Telephone:([phone]  Fax:([phone]) [place]: Jul 14, 1945  MR#: [id number]  RWE#:[id number]  Patient Care Team: Kirk Ruths, MD as PCP - General (Internal Medicine)  CHIEF COMPLAINT:  Chief Complaint  Patient presents with  . Breast Cancer   1. Right breast: Carcinoma of breast ultrasound-guided biopsy on the 20th November, 2014, positive for invasive mammary carcinoma. Stage IC (T1 C. N0 M0 tumor) Estrogen receptor positive.  Progesterone receptor positive.  HER-2/neu receptor negative by FISH. 2. Status post right partial Mastectomy.   3. Oncotype DX score is 16 week chances of recurrent disease in 10 years is 10% with tamoxifen 4. Patient will start radiation therapy followed by an aromatase inhibitor. 5. Started Letrozole, October 18, 2013.  No history exists.     INTERVAL HISTORY:  Patient had missed number of appointment because of what she described as an anxiety attack.  She continues to take letrozole calcium and vitamin D tolerating treatment very well. No bony pain no bony fracture.  She is due for mammogram.  Does not smoke.  Appetite has been stable.  REVIEW OF SYSTEMS:   GENERAL:  Feels good.  Active.  No fevers, sweats or weight loss. PERFORMANCE STATUS (ECOG)0 HEENT:  No visual changes, runny nose, sore throat, mouth sores or tenderness. Lungs: No shortness of breath or cough.  No hemoptysis. Cardiac:  No chest pain, palpitations, orthopnea, or PND. GI:  No nausea, vomiting, diarrhea, constipation, melena or hematochezia. GU:  No urgency, frequency, dysuria, or hematuria. Musculoskeletal:  No back pain.  No joint pain.  No muscle tenderness. Extremities:  No pain or swelling. Skin:  No rashes or skin changes. Neuro:  No headache, numbness or weakness, balance or coordination issues. Endocrine:  No diabetes, thyroid issues, hot flashes or night sweats. Psych:  Frequent  anxiety attacks Pain:  No focal pain. Review of systems:  All other systems reviewed and found to be negative. As per HPI. Otherwise, a complete review of systems is negatve.  PAST MEDICAL HISTORY: Past Medical History  Diagnosis Date  . Breast cancer ([place]) 04/2014    stage 1- rt breast- er/pr pos  . GERD (gastroesophageal reflux disease)   . Hypertension   . Hypercholesteremia   . Sleep apnea   . Diabetes mellitus without complication ([place])   . Anxiety   . IT band syndrome   . Fibromyalgia   . Frequency of urination     PAST SURGICAL HISTORY: Past Surgical History  Procedure Laterality Date  . Mastectomy Left 05/2013    partial  . Cataract extraction Right 2014    left 2005  . Detached retina repair    . Hernia repair  2000  . Cholecystectomy  1999  . Duodenum repair  1999    after puncture,clean up staph and peritonitis  . Carpal tunnel release Bilateral   . Uterine polyp      removed    FAMILY HISTORY Family History  Problem Relation Age of Onset  . Ovarian cancer Mother   . Diabetes Neg Hx   . Heart disease Neg Hx   . Breast cancer Neg Hx   . Colon cancer Neg Hx     ADVANCED DIRECTIVES:  No flowsheet data found.  HEALTH MAINTENANCE: Social History  Substance Use Topics  . Smoking status: Former Smoker    Quit date: 06/25/1975  . Smokeless tobacco: None  . Alcohol Use: No      Allergies  Allergen Reactions  . Biaxin [Clarithromycin]   . Codeine   . Eggs Or Egg-Derived Products   . Keflex [Cephalexin]   . Morphine And Related   . Penicillins   . Sulfa Antibiotics     Current Outpatient Prescriptions  Medication Sig Dispense Refill  . amitriptyline (ELAVIL) 50 MG tablet Take 1 tablet (50 mg total) by mouth at bedtime. 30 tablet 0  . citalopram (CELEXA) 20 MG tablet Take 20 mg by mouth daily.    . Diclofenac-Misoprostol (ARTHROTEC) 75-0.2 MG TBEC Take 2 tablets by mouth daily.    Marland Kitchen letrozole (FEMARA) 2.5 MG tablet Take 1 tablet (2.5 mg  total) by mouth daily. 30 tablet 0  . metFORMIN (GLUCOPHAGE) 500 MG tablet Take 500 mg by mouth 2 (two) times daily with a meal.    . metoprolol tartrate (LOPRESSOR) 25 MG tablet Take 25 mg by mouth daily.    . mometasone (NASONEX) 50 MCG/ACT nasal spray Place 2 sprays into the nose daily.    Marland Kitchen omeprazole (PRILOSEC) 20 MG capsule Take 20 mg by mouth daily.    . pravastatin (PRAVACHOL) 20 MG tablet Take 20 mg by mouth daily.     No current facility-administered medications for this visit.  Significant History/PMH:   diabetes:    fibromyalgia:    detached hernia:    cataract:    hernia:    uterine polyps:    bladder surg:   Preventive Screening:  Has patient had any of the following test? Mammography  Pap Smear   Last Mammography: 04/2013   Last Pap Smear: nov 2014   Smoking History: Smoking History Never Smoked.  PFSH: Comments: Mother had ovarian cancer no other significant family history of malignancy  Social History: negative alcohol, negative tobacco  Additional Past Medical and Surgical History: Non-insulin-dependent diabetes    OBJECTIVE:  Filed Vitals:   07/06/15 1506  BP: 135/81  Pulse: 92  Temp: 97.3 F (36.3 C)     There is no height on file to calculate BMI.    ECOG FS:0 - Asymptomatic  PHYSICAL EXAM: Gen. status: Alert oriented not in any acute distress Head exam was generally normal. There was no scleral icterus or corneal arcus. Mucous membranes were moist. Examination of both breasts: No palpable masses. Exactly lymph nodes are within normal limit Examination of the chest was unremarkable. There were no bony deformities, no asymmetry, and no other abnormalities. Cardiac exam revealed the PMI to be normally situated and sized. The rhythm was regular and no extrasystoles were noted during several minutes of auscultation. The first and second heart sounds were normal and physiologic splitting of the second heart sound was noted. There were no  murmurs, rubs, clicks, or gallops. Neurologically, the patient was awake, alert, and oriented to person, place and time. There were no obvious focal neurologic abnormalities. Examination of the skin revealed no evidence of significant rashes, suspicious appearing nevi or other concerning lesions.   LAB RESULTS:  CBC Latest Ref Rng 01/24/2014 10/18/2013  WBC 3.6-11.0 x10 3/mm  7.9 7.4  Hemoglobin 12.0-16.0 g/dL 11.7(L) 12.2  Hematocrit 35.0-47.0 % 35.1 36.7  Platelets 150-440 x10 3/mm  279 319    No visits with results within 5 Day(s) from this visit. Latest known visit with results is:  Abstract on 12/12/2014  Component  Date Value Ref Range Status  . HM Pap smear 03/18/2013 pap w/rflx- neg   Final  . HM Dexa Scan 05/04/2013 wnl   Final       ASSESSMENT: Carcinoma breast stage I C disease.  On letrozole calcium and vitamin D All lab data has been reviewed Repeat mammogram and bone density study has been ordered Continue letrozole and calcium and vitamin D  2. Anxiety attacks in managed by primary care physician Patient expressed understanding and was in agreement with this plan. She also understands that She can call clinic at any time with any questions, concerns, or complaints.    No matching staging information was found for the patient.  Forest Gleason, MD   07/06/2015 3:26 PM

## 2015-07-11 ENCOUNTER — Ambulatory Visit: Payer: Self-pay | Admitting: Obstetrics and Gynecology

## 2015-07-13 ENCOUNTER — Ambulatory Visit: Payer: Self-pay | Admitting: Obstetrics and Gynecology

## 2015-07-18 ENCOUNTER — Other Ambulatory Visit: Payer: Self-pay | Admitting: Oncology

## 2015-07-26 ENCOUNTER — Ambulatory Visit: Payer: Self-pay | Admitting: Obstetrics and Gynecology

## 2015-08-09 ENCOUNTER — Ambulatory Visit: Payer: Self-pay | Admitting: Obstetrics and Gynecology

## 2015-09-05 ENCOUNTER — Other Ambulatory Visit: Payer: Medicare Other

## 2015-09-05 ENCOUNTER — Ambulatory Visit: Payer: Medicare Other

## 2015-09-21 ENCOUNTER — Other Ambulatory Visit: Payer: Medicare Other

## 2015-09-21 ENCOUNTER — Ambulatory Visit: Payer: Medicare Other

## 2015-10-09 ENCOUNTER — Other Ambulatory Visit: Payer: Medicare Other

## 2015-10-09 ENCOUNTER — Ambulatory Visit: Payer: Medicare Other

## 2015-10-30 ENCOUNTER — Ambulatory Visit: Admission: RE | Admit: 2015-10-30 | Payer: Medicare Other | Source: Ambulatory Visit

## 2015-10-30 ENCOUNTER — Other Ambulatory Visit: Payer: Medicare Other

## 2015-11-16 ENCOUNTER — Inpatient Hospital Stay: Admission: RE | Admit: 2015-11-16 | Payer: Medicare Other | Source: Ambulatory Visit

## 2015-11-16 ENCOUNTER — Ambulatory Visit: Payer: Medicare Other | Attending: Oncology

## 2015-11-16 ENCOUNTER — Ambulatory Visit: Payer: Medicare Other

## 2015-11-23 ENCOUNTER — Encounter: Payer: Self-pay | Admitting: *Deleted

## 2015-11-23 NOTE — Progress Notes (Signed)
  Oncology Nurse Navigator Documentation  Navigator Location: [facility] (11/23/15 1200) Navigator Encounter Type: Telephone (11/23/15 1200) Telephone: Incoming Call (11/23/15 1200)             Barriers/Navigation Needs: Coordination of Care (11/23/15 1200)   Interventions: Coordination of Care (11/23/15 1200)            Acuity: Level 2 (11/23/15 1200)         Time Spent with Patient: 30 (11/23/15 1200)   Patient left me a message to help get her mammogram and bone density rescheduled for a 9 or 9:30 appointment.  Patient scheduled for December 27, 2015 @ 9:00.  Next follow up with medical oncology is July 12th at 2:45.  Could not reach patient by phone, but left her a message and mailed her a letter with the appointment dates and times.

## 2015-12-22 ENCOUNTER — Encounter: Payer: Self-pay | Admitting: *Deleted

## 2015-12-22 NOTE — Progress Notes (Signed)
  Oncology Nurse Navigator Documentation  Navigator Location: [facility] (12/22/15 1100) Navigator Encounter Type: Telephone (12/22/15 1100) Telephone: Outgoing Call (12/22/15 1100)         Patient Visit Type: Other (12/22/15 1100) Treatment Phase: Post-Tx Follow-up (12/22/15 1100) Barriers/Navigation Needs: Coordination of Care (12/22/15 1100)                Acuity: Level 2 (12/22/15 1100)         Time Spent with Patient: 15 (12/22/15 1100)   Patient had left me a message questioning the dates of her appointments.  Left her a message with appointment date for bone density and mammogram for 12/27/15 @ 9:00, and follow-up at the [facility] on 01/03/16 @ 2:45.

## 2015-12-27 ENCOUNTER — Other Ambulatory Visit: Payer: Medicare Other

## 2015-12-27 ENCOUNTER — Ambulatory Visit: Payer: Medicare Other | Attending: Oncology

## 2016-01-02 ENCOUNTER — Encounter: Payer: Self-pay | Admitting: *Deleted

## 2016-01-02 NOTE — Progress Notes (Signed)
  Oncology Nurse Navigator Documentation  Navigator Location: [facility] (01/02/16 1600) Navigator Encounter Type: Telephone (01/02/16 1600) Telephone: Incoming Call (01/02/16 1600)             Barriers/Navigation Needs: Coordination of Care (01/02/16 1600)                Acuity: Level 2 (01/02/16 1600)         Time Spent with Patient: 15 (01/02/16 1600)   Patient called and wanted to reschedule her appointment.  She has rescheduled her mammogram and wants to come in after 01/24/16.  Rescheduled patient to 01/26/16 with Dr. Rogue Bussing at 9:30.  Left patient a message with her appointment date and time.

## 2016-01-03 ENCOUNTER — Inpatient Hospital Stay: Payer: Medicare Other

## 2016-01-03 ENCOUNTER — Inpatient Hospital Stay: Payer: Medicare Other | Admitting: Family Medicine

## 2016-01-24 ENCOUNTER — Other Ambulatory Visit: Payer: Medicare Other

## 2016-01-24 ENCOUNTER — Inpatient Hospital Stay: Admission: RE | Admit: 2016-01-24 | Payer: Medicare Other | Source: Ambulatory Visit

## 2016-01-24 ENCOUNTER — Ambulatory Visit: Payer: Medicare Other | Attending: Oncology

## 2016-01-25 ENCOUNTER — Other Ambulatory Visit: Payer: Self-pay | Admitting: *Deleted

## 2016-01-25 DIAGNOSIS — Z853 Personal history of malignant neoplasm of breast: Secondary | ICD-10-CM

## 2016-01-26 ENCOUNTER — Inpatient Hospital Stay: Payer: BLUE CROSS/BLUE SHIELD

## 2016-01-26 ENCOUNTER — Inpatient Hospital Stay: Payer: BLUE CROSS/BLUE SHIELD | Admitting: Internal Medicine

## 2016-02-07 ENCOUNTER — Encounter: Payer: Self-pay | Admitting: *Deleted

## 2016-02-07 NOTE — Progress Notes (Signed)
  Oncology Nurse Navigator Documentation  Navigator Location: [facility] (02/07/16 1300) Navigator Encounter Type: Telephone (02/07/16 1300) Telephone: Incoming Call (02/07/16 1300)             Barriers/Navigation Needs: Coordination of Care (02/07/16 1300)   Interventions: Coordination of Care (02/07/16 1300)   Coordination of Care: Appts (02/07/16 1300)                  Time Spent with Patient: 30 (02/07/16 1300)  Patient called and states she missed her mammogram and has tried to reschedule, but could not get through to [place].  Rescheduled patient's appointment for 03/11/16 @ 1:40.  Left her a message with the appointment date and time.  Also encouraged her to keep her appointment with Dr. Rogue Bussing.

## 2016-02-09 ENCOUNTER — Inpatient Hospital Stay: Payer: Medicare Other | Admitting: Internal Medicine

## 2016-02-09 ENCOUNTER — Inpatient Hospital Stay: Payer: Medicare Other

## 2016-03-06 ENCOUNTER — Other Ambulatory Visit: Payer: Self-pay | Admitting: Oncology

## 2016-03-06 DIAGNOSIS — C50919 Malignant neoplasm of unspecified site of unspecified female breast: Secondary | ICD-10-CM

## 2016-03-11 ENCOUNTER — Other Ambulatory Visit: Payer: Medicare Other

## 2016-03-11 ENCOUNTER — Ambulatory Visit: Admission: RE | Admit: 2016-03-11 | Payer: Medicare Other | Source: Ambulatory Visit

## 2016-03-18 ENCOUNTER — Other Ambulatory Visit: Payer: Self-pay | Admitting: Internal Medicine

## 2016-03-18 DIAGNOSIS — C50919 Malignant neoplasm of unspecified site of unspecified female breast: Secondary | ICD-10-CM

## 2016-03-21 ENCOUNTER — Inpatient Hospital Stay: Payer: Medicare Other | Admitting: Internal Medicine

## 2016-03-21 ENCOUNTER — Inpatient Hospital Stay: Payer: Medicare Other

## 2016-04-05 ENCOUNTER — Other Ambulatory Visit: Payer: Medicare Other

## 2016-04-05 ENCOUNTER — Ambulatory Visit: Payer: Medicare Other | Admitting: Internal Medicine

## 2016-04-10 ENCOUNTER — Other Ambulatory Visit: Payer: Self-pay | Admitting: Internal Medicine

## 2016-04-10 DIAGNOSIS — C50919 Malignant neoplasm of unspecified site of unspecified female breast: Secondary | ICD-10-CM

## 2016-05-14 ENCOUNTER — Other Ambulatory Visit: Payer: Self-pay | Admitting: *Deleted

## 2016-05-14 ENCOUNTER — Other Ambulatory Visit: Payer: Self-pay | Admitting: Internal Medicine

## 2016-05-14 DIAGNOSIS — C50919 Malignant neoplasm of unspecified site of unspecified female breast: Secondary | ICD-10-CM

## 2016-05-14 NOTE — Telephone Encounter (Signed)
She has a refill on current prescription. I called and left her a message

## 2016-05-20 ENCOUNTER — Ambulatory Visit
Admission: RE | Admit: 2016-05-20 | Discharge: 2016-05-20 | Disposition: A | Discharge status: Home / Self Care | Payer: BLUE CROSS/BLUE SHIELD | Source: Ambulatory Visit | Attending: Oncology | Admitting: Oncology

## 2016-05-20 DIAGNOSIS — C50919 Malignant neoplasm of unspecified site of unspecified female breast: Secondary | ICD-10-CM

## 2016-05-27 ENCOUNTER — Inpatient Hospital Stay: Payer: Medicare Other | Admitting: Internal Medicine

## 2016-05-27 ENCOUNTER — Inpatient Hospital Stay: Payer: Medicare Other

## 2016-05-27 ENCOUNTER — Encounter: Payer: Self-pay | Admitting: Internal Medicine

## 2016-05-27 DIAGNOSIS — Z170 Estrogen receptor positive status [ER+]: Secondary | ICD-10-CM | POA: Insufficient documentation

## 2016-05-27 DIAGNOSIS — C50811 Malignant neoplasm of overlapping sites of right female breast: Secondary | ICD-10-CM | POA: Insufficient documentation

## 2016-06-07 ENCOUNTER — Inpatient Hospital Stay: Payer: BLUE CROSS/BLUE SHIELD

## 2016-06-07 ENCOUNTER — Inpatient Hospital Stay: Payer: BLUE CROSS/BLUE SHIELD | Admitting: Internal Medicine

## 2016-06-20 ENCOUNTER — Ambulatory Visit: Payer: BLUE CROSS/BLUE SHIELD

## 2016-06-20 ENCOUNTER — Other Ambulatory Visit: Payer: BLUE CROSS/BLUE SHIELD

## 2016-06-21 ENCOUNTER — Other Ambulatory Visit: Payer: Self-pay | Admitting: Internal Medicine

## 2016-06-21 DIAGNOSIS — C50919 Malignant neoplasm of unspecified site of unspecified female breast: Secondary | ICD-10-CM

## 2016-06-25 ENCOUNTER — Other Ambulatory Visit: Payer: Self-pay | Admitting: Internal Medicine

## 2016-06-25 DIAGNOSIS — C50919 Malignant neoplasm of unspecified site of unspecified female breast: Secondary | ICD-10-CM

## 2016-06-26 ENCOUNTER — Ambulatory Visit: Payer: BLUE CROSS/BLUE SHIELD

## 2016-06-26 ENCOUNTER — Other Ambulatory Visit: Payer: BLUE CROSS/BLUE SHIELD

## 2016-06-28 ENCOUNTER — Inpatient Hospital Stay: Payer: Medicare Other

## 2016-06-28 ENCOUNTER — Inpatient Hospital Stay: Payer: Medicare Other | Admitting: Oncology

## 2016-07-12 ENCOUNTER — Inpatient Hospital Stay: Payer: Medicare Other

## 2016-07-12 ENCOUNTER — Inpatient Hospital Stay: Payer: Medicare Other | Admitting: Oncology

## 2016-07-12 NOTE — Progress Notes (Deleted)
Hematology/Oncology Consult note [facility]  [phone] [phone]  Patient Care Team: Kirk Ruths, MD as PCP - General (Internal Medicine)   Name of the patient: Gabriella [name]  [id number]  [birth year]   Date of visit: 07/12/16  Diagnosis- Stage 1 right breast cancer  Chief complaint/ Reason for visit- routine f/u of breast cancer  Heme/Onc history: 1. Right breast: Carcinoma of breast ultrasound-guided biopsy on the 20th November, 2014, positive for invasive mammary carcinoma. Stage IC (T1 C. N0 M0 tumor) Estrogen receptor positive.  Progesterone receptor positive.  HER-2/neu receptor negative by FISH. 2. Status post right partial Mastectomy.   3. Oncotype DX score was 16- low risk and did not require any adjuvant chemotherapy 4. Patient will start radiation therapy followed by an aromatase inhibitor. 5. Started Letrozole, October 18, 2013. 6. Last bone density scan was in 2014 which was normal.  Interval history- ***  ECOG PS- *** Pain scale- *** Opioid associated constipation- ***  Review of systems- Review of Systems  Constitutional: Negative for chills, fever, malaise/fatigue and weight loss.  HENT: Negative for congestion, ear discharge and nosebleeds.   Eyes: Negative for blurred vision.  Respiratory: Negative for cough, hemoptysis, sputum production, shortness of breath and wheezing.   Cardiovascular: Negative for chest pain, palpitations, orthopnea and claudication.  Gastrointestinal: Negative for abdominal pain, blood in stool, constipation, diarrhea, heartburn, melena, nausea and vomiting.  Genitourinary: Negative for dysuria, flank pain, frequency, hematuria and urgency.  Musculoskeletal: Negative for back pain, joint pain and myalgias.  Skin: Negative for rash.  Neurological: Negative for dizziness, tingling, focal weakness, seizures, weakness and headaches.  Endo/Heme/Allergies: Does not bruise/bleed easily.    Psychiatric/Behavioral: Negative for depression and suicidal ideas. The patient does not have insomnia.      Current treatment- letrozole  Allergies  Allergen Reactions  . Biaxin [Clarithromycin]   . Codeine   . Eggs Or Egg-Derived Products   . Keflex [Cephalexin]   . Morphine And Related   . Penicillins   . Sulfa Antibiotics      Past Medical History:  Diagnosis Date  . Anxiety   . Breast cancer ([place]) 04/2014   stage 1- rt breast- er/pr pos  . Diabetes mellitus without complication ([place])   . Fibromyalgia   . Frequency of urination   . GERD (gastroesophageal reflux disease)   . Hypercholesteremia   . Hypertension   . IT band syndrome   . Sleep apnea      Past Surgical History:  Procedure Laterality Date  . CARPAL TUNNEL RELEASE Bilateral   . CATARACT EXTRACTION Right 2014   left 2005  . CHOLECYSTECTOMY  1999  . detached retina repair    . duodenum repair  1999   after puncture,clean up staph and peritonitis  . HERNIA REPAIR  2000  . MASTECTOMY Left 05/2013   partial  . uterine polyp     removed    Social History   Social History  . Marital status: Married    Spouse name: N/A  . Number of children: N/A  . Years of education: N/A   Occupational History  . Not on file.   Social History Main Topics  . Smoking status: Former Smoker    Quit date: 06/25/1975  . Smokeless tobacco: Not on file  . Alcohol use No  . Drug use: No  . Sexual activity: Yes   Other Topics Concern  . Not on file   Social History Narrative  .  No narrative on file    Family History  Problem Relation Age of Onset  . Ovarian cancer Mother   . Diabetes Neg Hx   . Heart disease Neg Hx   . Breast cancer Neg Hx   . Colon cancer Neg Hx      Current Outpatient Prescriptions:  .  amitriptyline (ELAVIL) 50 MG tablet, Take 1 tablet (50 mg total) by mouth at bedtime., Disp: 30 tablet, Rfl: 0 .  citalopram (CELEXA) 20 MG tablet, Take 20 mg by mouth daily., Disp: , Rfl:  .   Diclofenac-Misoprostol (ARTHROTEC) 75-0.2 MG TBEC, Take 2 tablets by mouth daily., Disp: , Rfl:  .  letrozole (FEMARA) 2.5 MG tablet, TAKE ONE TABLET BY MOUTH DAILY, Disp: 30 tablet, Rfl: 1 .  metFORMIN (GLUCOPHAGE) 500 MG tablet, Take 500 mg by mouth 2 (two) times daily with a meal., Disp: , Rfl:  .  metoprolol tartrate (LOPRESSOR) 25 MG tablet, Take 25 mg by mouth daily., Disp: , Rfl:  .  mometasone (NASONEX) 50 MCG/ACT nasal spray, Place 2 sprays into the nose daily., Disp: , Rfl:  .  omeprazole (PRILOSEC) 20 MG capsule, Take 20 mg by mouth daily., Disp: , Rfl:  .  pravastatin (PRAVACHOL) 20 MG tablet, Take 20 mg by mouth daily., Disp: , Rfl:   Physical exam: There were no vitals filed for this visit. Physical Exam  Constitutional: She is oriented to person, place, and time and well-developed, well-nourished, and in no distress.  HENT:  Head: Normocephalic and atraumatic.  Eyes: EOM are normal. Pupils are equal, round, and reactive to light.  Neck: Normal range of motion.  Cardiovascular: Normal rate, regular rhythm and normal heart sounds.   Pulmonary/Chest: Effort normal and breath sounds normal.  Abdominal: Soft. Bowel sounds are normal.  Neurological: She is alert and oriented to person, place, and time.  Skin: Skin is warm and dry.     CMP Latest Ref Rng & Units 07/06/2015  Glucose 65 - 99 mg/dL 149(H)  BUN 6 - 20 mg/dL 9  Creatinine 0.44 - 1.00 mg/dL 0.77  Sodium 135 - 145 mmol/L 133(L)  Potassium 3.5 - 5.1 mmol/L 3.3(L)  Chloride 101 - 111 mmol/L 99(L)  CO2 22 - 32 mmol/L 29  Calcium 8.9 - 10.3 mg/dL 9.2  Total Protein 6.5 - 8.1 g/dL 7.6  Total Bilirubin 0.3 - 1.2 mg/dL 0.5  Alkaline Phos 38 - 126 U/L 92  AST 15 - 41 U/L 18  ALT 14 - 54 U/L 15   CBC Latest Ref Rng & Units 07/06/2015  WBC 3.6 - 11.0 K/uL 11.7(H)  Hemoglobin 12.0 - 16.0 g/dL 12.6  Hematocrit 35.0 - 47.0 % 37.4  Platelets 150 - 440 K/uL 348    No images are attached to the encounter.  No results  found.   Assessment and plan- Patient is a 71 y.o. female ***   Visit Diagnosis No diagnosis found.   Dr. Randa Evens, MD, MPH [place] at [facility] Pager- [phone] 07/12/2016 8:49 AM

## 2016-07-26 ENCOUNTER — Inpatient Hospital Stay: Payer: Medicare Other | Admitting: Oncology

## 2016-07-26 ENCOUNTER — Inpatient Hospital Stay: Payer: Medicare Other

## 2016-07-26 NOTE — Progress Notes (Deleted)
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 And is status post right partial mastectomy. Oncotype DX score came back as 16 and she did not require any adjuvant chemotherapy. She did complete adjuvant radiation therapy and was started on letrozole in April 2015.  Patient's last bone density scan was in 2014 which was normal  Interval history- ***  ECOG PS- *** Pain scale- *** Opioid associated constipation- ***  Review of systems- Review of Systems  Constitutional: Negative for chills, fever, malaise/fatigue and weight loss.  HENT: Negative for congestion, ear discharge and nosebleeds.   Eyes: Negative for blurred vision.  Respiratory: Negative for cough, hemoptysis, sputum production, shortness of breath and wheezing.   Cardiovascular: Negative for chest pain, palpitations, orthopnea and claudication.  Gastrointestinal: Negative for abdominal pain, blood in stool, constipation, diarrhea, heartburn, melena, nausea and vomiting.  Genitourinary: Negative for dysuria, flank pain, frequency, hematuria and urgency.  Musculoskeletal: Negative for back pain, joint pain and myalgias.  Skin: Negative for rash.  Neurological: Negative for dizziness, tingling, focal weakness, seizures, weakness and headaches.  Endo/Heme/Allergies: Does not bruise/bleed easily.    Psychiatric/Behavioral: Negative for depression and suicidal ideas. The patient does not have insomnia.      Current treatment- letrozole  Allergies  Allergen Reactions  . Biaxin [Clarithromycin]   . Codeine   . Eggs Or Egg-Derived Products   . Keflex [Cephalexin]   . Morphine And Related   . Penicillins   . Sulfa Antibiotics      Past Medical History:  Diagnosis Date  . Anxiety   . Breast cancer ([place]) 04/2014   stage 1- rt breast- er/pr pos  . Diabetes mellitus without complication ([place])   . Fibromyalgia   . Frequency of urination   . GERD (gastroesophageal reflux disease)   . Hypercholesteremia   . Hypertension   . IT band syndrome   . Sleep apnea      Past Surgical History:  Procedure Laterality Date  . CARPAL TUNNEL RELEASE Bilateral   . CATARACT EXTRACTION Right 2014   left 2005  . CHOLECYSTECTOMY  1999  . detached retina repair    . duodenum repair  1999   after puncture,clean up staph and peritonitis  . HERNIA REPAIR  2000  . MASTECTOMY Left 05/2013   partial  . uterine polyp     removed    Social History   Social History  . Marital status: Married    Spouse name: N/A  . Number of children: N/A  . Years of education: N/A   Occupational History  . Not on file.   Social History Main Topics  . Smoking status: Former Smoker    Quit date: 06/25/1975  . Smokeless tobacco: Not on file  . Alcohol use No  . Drug use: No  . Sexual activity: Yes   Other Topics Concern  . Not on file  Social History Narrative  . No narrative on file    Family History  Problem Relation Age of Onset  . Ovarian cancer Mother   . Diabetes Neg Hx   . Heart disease Neg Hx   . Breast cancer Neg Hx   . Colon cancer Neg Hx      Current Outpatient Prescriptions:  .  amitriptyline (ELAVIL) 50 MG tablet, Take 1 tablet (50 mg total) by mouth at bedtime., Disp: 30 tablet, Rfl: 0 .  citalopram (CELEXA) 20 MG tablet, Take 20 mg by mouth daily., Disp: , Rfl:  .   Diclofenac-Misoprostol (ARTHROTEC) 75-0.2 MG TBEC, Take 2 tablets by mouth daily., Disp: , Rfl:  .  letrozole (FEMARA) 2.5 MG tablet, TAKE ONE TABLET BY MOUTH DAILY, Disp: 30 tablet, Rfl: 1 .  metFORMIN (GLUCOPHAGE) 500 MG tablet, Take 500 mg by mouth 2 (two) times daily with a meal., Disp: , Rfl:  .  metoprolol tartrate (LOPRESSOR) 25 MG tablet, Take 25 mg by mouth daily., Disp: , Rfl:  .  mometasone (NASONEX) 50 MCG/ACT nasal spray, Place 2 sprays into the nose daily., Disp: , Rfl:  .  omeprazole (PRILOSEC) 20 MG capsule, Take 20 mg by mouth daily., Disp: , Rfl:  .  pravastatin (PRAVACHOL) 20 MG tablet, Take 20 mg by mouth daily., Disp: , Rfl:   Physical exam: There were no vitals filed for this visit. Physical Exam  Constitutional: She is oriented to person, place, and time and well-developed, well-nourished, and in no distress.  HENT:  Head: Normocephalic and atraumatic.  Eyes: EOM are normal. Pupils are equal, round, and reactive to light.  Neck: Normal range of motion.  Cardiovascular: Normal rate, regular rhythm and normal heart sounds.   Pulmonary/Chest: Effort normal and breath sounds normal.  Abdominal: Soft. Bowel sounds are normal.  Neurological: She is alert and oriented to person, place, and time.  Skin: Skin is warm and dry.     CMP Latest Ref Rng & Units 07/06/2015  Glucose 65 - 99 mg/dL 699(Z)  BUN 6 - 20 mg/dL 9  Creatinine 8.02 - 0.89 mg/dL 1.00  Sodium 262 - 854 mmol/L 133(L)  Potassium 3.5 - 5.1 mmol/L 3.3(L)  Chloride 101 - 111 mmol/L 99(L)  CO2 22 - 32 mmol/L 29  Calcium 8.9 - 10.3 mg/dL 9.2  Total Protein 6.5 - 8.1 g/dL 7.6  Total Bilirubin 0.3 - 1.2 mg/dL 0.5  Alkaline Phos 38 - 126 U/L 92  AST 15 - 41 U/L 18  ALT 14 - 54 U/L 15   CBC Latest Ref Rng & Units 07/06/2015  WBC 3.6 - 11.0 K/uL 11.7(H)  Hemoglobin 12.0 - 16.0 g/dL 96.5  Hematocrit 65.9 - 47.0 % 37.4  Platelets 150 - 440 K/uL 348    No images are attached to the encounter.  No results  found.   Assessment and plan- Patient is a 71 y.o. 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 She is currently on hormone therapy   1. Patient will continue letrozole for total period of 5 years ending in 2020. She will also continue her calcium and vitamin D supplements. She is due for another bone density scan which we will order today. Patient's last mammogram was in March 2016 which was normal and she is due for another mammogram at this time which we will order as well.  I will  see her back in 6 months time   Visit Diagnosis No diagnosis found.   Dr. Randa Evens, MD, MPH [place] at [facility] Pager- [phone] 07/26/2016

## 2016-07-30 ENCOUNTER — Telehealth: Payer: Self-pay | Admitting: *Deleted

## 2016-07-31 ENCOUNTER — Other Ambulatory Visit: Payer: Self-pay | Admitting: *Deleted

## 2016-07-31 DIAGNOSIS — C50811 Malignant neoplasm of overlapping sites of right female breast: Secondary | ICD-10-CM

## 2016-07-31 DIAGNOSIS — Z780 Asymptomatic menopausal state: Secondary | ICD-10-CM

## 2016-07-31 DIAGNOSIS — Z170 Estrogen receptor positive status [ER+]: Principal | ICD-10-CM

## 2016-08-05 ENCOUNTER — Telehealth: Payer: Self-pay | Admitting: *Deleted

## 2016-08-05 NOTE — Telephone Encounter (Signed)
Patient called last week wanting to cancel her appointment and reschedule her appointments.  She has cancelled and rescheduled many times in the past year.  States she is dealing with depression and really wants to come in.  I have rescheduled her mammogram and bone density to 09/23/16 @ 1:00 and her follow-up with Dr. Hedwig Morton on 09/26/16 @ 9:30.  I have talked with her today and given her those dates.  She is to try her best to keep the appointments.  She is to call me if she needs me to meet her at the breast center.

## 2016-09-16 ENCOUNTER — Telehealth: Payer: Self-pay | Admitting: *Deleted

## 2016-09-16 NOTE — Telephone Encounter (Signed)
Patient called and wanted to know her appointment time.  I have left her a message with her appointment for bone density and mammogram on 09/23/16 @ 1:00 and appointment with Dr. Hedwig Morton on 09/26/16 @ 9:30.

## 2016-09-23 ENCOUNTER — Other Ambulatory Visit: Payer: Medicare Other

## 2016-09-23 ENCOUNTER — Inpatient Hospital Stay: Admission: RE | Admit: 2016-09-23 | Payer: Medicare Other | Source: Ambulatory Visit

## 2016-09-26 ENCOUNTER — Ambulatory Visit: Payer: Medicare Other | Admitting: Oncology

## 2016-10-18 ENCOUNTER — Encounter: Payer: Self-pay | Admitting: Obstetrics and Gynecology

## 2016-12-24 NOTE — Telephone Encounter (Signed)
done

## 2017-05-26 ENCOUNTER — Encounter: Payer: Self-pay | Admitting: Oncology

## 2017-05-28 ENCOUNTER — Encounter: Payer: Self-pay | Admitting: *Deleted

## 2017-05-30 ENCOUNTER — Telehealth: Payer: Self-pay | Admitting: *Deleted

## 2017-05-30 NOTE — Telephone Encounter (Signed)
Certified letter to pt closing her chart to our services.

## 2017-07-24 ENCOUNTER — Encounter: Payer: Self-pay | Admitting: Hematology and Oncology

## 2017-08-18 ENCOUNTER — Ambulatory Visit: Payer: Medicare Other | Admitting: Hematology and Oncology

## 2017-09-02 ENCOUNTER — Ambulatory Visit: Payer: Medicare Other | Admitting: Hematology and Oncology

## 2017-09-02 NOTE — Assessment & Plan Note (Deleted)
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  Breast exam 09/02/2017: Benign 2. mammogram: Patient gets this in March of every year.  I discussed with the patient about duration of antiestrogen therapy.  Standard duration is between 5-7 years.  When she returns next year we can decide what to do about her antiestrogen treatment.

## 2017-09-23 ENCOUNTER — Telehealth: Payer: Self-pay | Admitting: Hematology and Oncology

## 2017-09-23 NOTE — Telephone Encounter (Signed)
Left message on voicemail / returned her call from 4/2 phone msg

## 2017-09-24 ENCOUNTER — Ambulatory Visit: Payer: Medicare Other | Admitting: Hematology and Oncology

## 2017-09-24 NOTE — Assessment & Plan Note (Deleted)
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  Breast exam 09/24/2017: Benign 2. mammogram: Patient gets this in March of every year.  I discussed with the patient about duration of antiestrogen therapy.  Standard duration is between 5-7 years.  When she returns next year we can decide what to do about her antiestrogen treatment.

## 2017-10-20 ENCOUNTER — Telehealth: Payer: Self-pay | Admitting: Hematology

## 2017-10-20 ENCOUNTER — Inpatient Hospital Stay: Payer: Medicare Other | Admitting: Hematology and Oncology

## 2017-10-20 NOTE — Telephone Encounter (Signed)
PATIENT called to reschedule she wanted a female doctor. Seth Bake actually rescheduled patient at my desk

## 2017-11-12 ENCOUNTER — Inpatient Hospital Stay: Payer: Medicare Other | Admitting: Hematology

## 2017-11-24 ENCOUNTER — Telehealth: Payer: Self-pay | Admitting: Hematology

## 2017-11-24 NOTE — Telephone Encounter (Signed)
Patient called to cancel °

## 2017-11-25 ENCOUNTER — Inpatient Hospital Stay: Payer: Medicare Other | Admitting: Hematology

## 2017-12-05 ENCOUNTER — Inpatient Hospital Stay: Payer: Medicare Other | Admitting: Nurse Practitioner

## 2017-12-11 ENCOUNTER — Ambulatory Visit: Payer: Medicare Other | Admitting: Nurse Practitioner

## 2017-12-12 ENCOUNTER — Inpatient Hospital Stay: Payer: Medicare Other | Admitting: Nurse Practitioner

## 2017-12-30 ENCOUNTER — Inpatient Hospital Stay: Payer: Medicare Other | Attending: Hematology and Oncology | Admitting: Hematology and Oncology

## 2017-12-30 ENCOUNTER — Telehealth: Payer: Self-pay | Admitting: Hematology and Oncology

## 2017-12-30 VITALS — BP 151/65 | HR 104 | Temp 98.0°F | Resp 18 | Wt 210.4 lb

## 2017-12-30 DIAGNOSIS — Z8041 Family history of malignant neoplasm of ovary: Secondary | ICD-10-CM | POA: Insufficient documentation

## 2017-12-30 DIAGNOSIS — C50312 Malignant neoplasm of lower-inner quadrant of left female breast: Secondary | ICD-10-CM | POA: Diagnosis not present

## 2017-12-30 DIAGNOSIS — G4730 Sleep apnea, unspecified: Secondary | ICD-10-CM | POA: Diagnosis not present

## 2017-12-30 DIAGNOSIS — E119 Type 2 diabetes mellitus without complications: Secondary | ICD-10-CM | POA: Insufficient documentation

## 2017-12-30 DIAGNOSIS — K219 Gastro-esophageal reflux disease without esophagitis: Secondary | ICD-10-CM | POA: Diagnosis not present

## 2017-12-30 DIAGNOSIS — C50811 Malignant neoplasm of overlapping sites of right female breast: Secondary | ICD-10-CM | POA: Diagnosis not present

## 2017-12-30 DIAGNOSIS — M797 Fibromyalgia: Secondary | ICD-10-CM | POA: Diagnosis not present

## 2017-12-30 DIAGNOSIS — Z923 Personal history of irradiation: Secondary | ICD-10-CM | POA: Diagnosis not present

## 2017-12-30 DIAGNOSIS — Z79811 Long term (current) use of aromatase inhibitors: Secondary | ICD-10-CM | POA: Diagnosis not present

## 2017-12-30 DIAGNOSIS — Z87891 Personal history of nicotine dependence: Secondary | ICD-10-CM | POA: Insufficient documentation

## 2017-12-30 DIAGNOSIS — E7800 Pure hypercholesterolemia, unspecified: Secondary | ICD-10-CM | POA: Diagnosis not present

## 2017-12-30 DIAGNOSIS — Z170 Estrogen receptor positive status [ER+]: Secondary | ICD-10-CM | POA: Diagnosis not present

## 2017-12-30 DIAGNOSIS — I10 Essential (primary) hypertension: Secondary | ICD-10-CM | POA: Insufficient documentation

## 2017-12-30 DIAGNOSIS — Z79899 Other long term (current) drug therapy: Secondary | ICD-10-CM | POA: Insufficient documentation

## 2017-12-30 DIAGNOSIS — Z7984 Long term (current) use of oral hypoglycemic drugs: Secondary | ICD-10-CM | POA: Insufficient documentation

## 2017-12-30 MED ORDER — LETROZOLE 2.5 MG PO TABS: 2.5000 mg | ORAL_TABLET | Freq: Every day | ORAL | 3 refills | Status: DC

## 2017-12-30 NOTE — Assessment & Plan Note (Addendum)
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  She stopped taking it because of stress regarding her husband's health issues as well as cost of the drug.  Changed provider because she missed too many appointments at the [place] [facility] and then no longer willing to see her.  Counseling: I discussed with her the importance of keeping her appointments.  She is worried about breast cancer recurrence.  Plan: Mammogram to be done at the breast center I renewed her prescription for letrozole which she will take for the next 3 years. I encouraged her to exercise regularly.  Return to clinic once a year for follow-ups.

## 2017-12-30 NOTE — Progress Notes (Signed)
[place] NOTE  Patient Care Team: Kirk Ruths, MD as PCP - General (Internal Medicine)  CHIEF COMPLAINTS/PURPOSE OF CONSULTATION:  Prior history of breast cancer  HISTORY OF PRESENTING ILLNESS:  Gabriella [name] 72 y.o. female is here because of prior history of breast cancer diagnosed in November 2014.  She was seen by Dr. Oliva Bustard.  She initially underwent lumpectomy followed by adjuvant radiation and antiestrogen therapy with letrozole for 2 years.  Because of her husband's health issues possibly when the cost of the drug, she decided to stop it.  She had canceled multiple appointments at [facility] and therefore they are no longer willing to see her.  Because of this she decided to come to [place] instead.  She promises that she will stay compliant and come to all her visits.  She denies any lumps or nodules in the breast.  I reviewed her records extensively and collaborated the history with the patient.  SUMMARY OF ONCOLOGIC HISTORY: Oncology History   # NOV 20th 2014- Right breast: Carcinoma of breast-Bx positive for invasive mammary carcinoma. Stage IC (T1 C. N0 M0 tumor) Estrogen receptor positive.  Progesterone receptor positive.  HER-2/neu receptor negative by FISH. 2. Status post right partial Mastectomy.   3. Oncotype DX score is 16 week chances of recurrent disease in 10 years is 10% with tamoxifen 4. Patient will start radiation therapy followed by an aromatase inhibitor. 5. 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  Restarted July 2019      MEDICAL HISTORY:  Past Medical History:  Diagnosis Date  . Anxiety   . Breast cancer ([place]) 04/2014   stage 1- rt breast- er/pr pos  . Diabetes mellitus without complication ([place])   . Fibromyalgia   . Frequency of urination   . GERD (gastroesophageal reflux disease)   . Hypercholesteremia   . Hypertension   . IT band syndrome   . Sleep apnea     SURGICAL HISTORY: Prior breast lumpectomy  SOCIAL HISTORY: Social History   Socioeconomic History  . Marital status: Married    Spouse name: Not on file  . Number of children: Not on file  . Years of education: Not on file  . Highest education level: Not on file  Occupational History  . Not on file  Social Needs  . Financial resource strain: Not on file  . Food insecurity:    Worry: Not on file    Inability: Not on file  . Transportation needs:    Medical: Not on file    Non-medical: Not on file  Tobacco Use  . Smoking status: Former Smoker    Last attempt to quit: 06/25/1975    Years since quitting: 42.5  Substance and Sexual Activity  . Alcohol use: No  . Drug use: No  . Sexual activity: Yes  Lifestyle  . Physical activity:    Days per week: Not on file    Minutes per session: Not on file  . Stress: Not on file  Relationships  .  Social connections:    Talks on phone: Not on file    Gets together: Not on file    Attends religious service: Not on file    Active member of club or organization: Not on file    Attends meetings of clubs or organizations: Not on file    Relationship status: Not on file  . Intimate partner violence:    Fear of current or ex partner: Not on file    Emotionally abused: Not on file    Physically abused: Not on file    Forced sexual activity: Not on file  Other Topics Concern  . Not on file  Social History Narrative  . Not on file    FAMILY HISTORY: Family History  Problem Relation Age of Onset  . Ovarian cancer Mother   . Diabetes Neg Hx   .  Heart disease Neg Hx   . Breast cancer Neg Hx   . Colon cancer Neg Hx     ALLERGIES:  is allergic to biaxin [clarithromycin]; codeine; eggs or egg-derived products; keflex [cephalexin]; morphine and related; penicillins; and sulfa antibiotics.  MEDICATIONS:  Current Outpatient Medications  Medication Sig Dispense Refill  . amitriptyline (ELAVIL) 50 MG tablet Take 1 tablet (50 mg total) by mouth at bedtime. 30 tablet 0  . letrozole (FEMARA) 2.5 MG tablet Take 1 tablet (2.5 mg total) by mouth daily. 90 tablet 3  . metFORMIN (GLUCOPHAGE) 500 MG tablet Take 500 mg by mouth 2 (two) times daily with a meal.    . metoprolol tartrate (LOPRESSOR) 25 MG tablet Take 25 mg by mouth daily.    . mometasone (NASONEX) 50 MCG/ACT nasal spray Place 2 sprays into the nose daily.    . pravastatin (PRAVACHOL) 20 MG tablet Take 20 mg by mouth daily.     No current facility-administered medications for this visit.     REVIEW OF SYSTEMS:   Constitutional: Denies fevers, chills or abnormal night sweats Eyes: Denies blurriness of vision, double vision or watery eyes Ears, nose, mouth, throat, and face: Denies mucositis or sore throat Respiratory: Denies cough, dyspnea or wheezes Cardiovascular: Denies palpitation, chest discomfort or lower extremity swelling Gastrointestinal:  Denies nausea, heartburn or change in bowel habits Skin: Denies abnormal skin rashes Lymphatics: Denies new lymphadenopathy or easy bruising Neurological:Denies numbness, tingling or new weaknesses Behavioral/Psych: Mood is stable, no new changes  Breast:  Denies any palpable lumps or discharge All other systems were reviewed with the patient and are negative.  PHYSICAL EXAMINATION: ECOG PERFORMANCE STATUS: 1 - Symptomatic but completely ambulatory  Vitals:   12/30/17 1304  BP: (!) 151/65  Pulse: (!) 104  Resp: 18  Temp: 98 F (36.7 C)  SpO2: 98%   Filed Weights   12/30/17 1304  Weight: 210 lb 6.4 oz (95.4 kg)     GENERAL:alert, no distress and comfortable SKIN: skin color, texture, turgor are normal, no rashes or significant lesions EYES: normal, conjunctiva are pink and non-injected, sclera clear OROPHARYNX:no exudate, no erythema and lips, buccal mucosa, and tongue normal  NECK: supple, thyroid normal size, non-tender, without nodularity LYMPH:  no palpable lymphadenopathy in the cervical, axillary or inguinal LUNGS: clear to auscultation and percussion with normal breathing effort HEART: regular rate & rhythm and no murmurs and no lower extremity edema ABDOMEN:abdomen soft, non-tender and normal bowel sounds Musculoskeletal:no cyanosis of digits and no clubbing  PSYCH: alert & oriented x 3 with fluent speech NEURO: no focal motor/sensory deficits BREAST: No palpable nodules in breast.  No palpable axillary or supraclavicular lymphadenopathy (exam performed in the presence of a chaperone)   LABORATORY DATA:  I have reviewed the data as listed Lab Results  Component Value Date   WBC 11.7 (H) 07/06/2015   HGB 12.6 07/06/2015   HCT 37.4 07/06/2015   MCV 86.0 07/06/2015   PLT 348 07/06/2015   Lab Results  Component Value Date   NA 133 (L) 07/06/2015   K 3.3 (L) 07/06/2015   CL 99 (L) 07/06/2015   CO2 29 07/06/2015    RADIOGRAPHIC STUDIES: I have personally reviewed the radiological reports and agreed with the findings in the report.  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  She stopped taking it because of stress regarding her husband's health issues as well as cost of the drug.  Changed provider because she missed  too many appointments at the [place] center and then no longer willing to see her.  Counseling: I discussed with her the importance of keeping her appointments.  She is worried about breast cancer recurrence.  Plan: Mammogram to be done at the breast center I renewed her prescription for letrozole which she will take for the next 3 years. I encouraged her to exercise regularly.  Return to clinic once a year for follow-ups.    All questions were answered. The patient knows to call the clinic with any problems, questions or concerns.    Harriette Ohara, MD 12/30/17

## 2017-12-30 NOTE — Telephone Encounter (Signed)
Gave avs and calendar ° °

## 2018-04-14 ENCOUNTER — Inpatient Hospital Stay: Admission: RE | Admit: 2018-04-14 | Payer: Medicare Other | Source: Ambulatory Visit

## 2018-05-08 ENCOUNTER — Ambulatory Visit
Admission: RE | Admit: 2018-05-08 | Discharge: 2018-05-08 | Disposition: A | Discharge status: Home / Self Care | Payer: Medicare Other | Source: Ambulatory Visit | Attending: Hematology and Oncology | Admitting: Hematology and Oncology

## 2018-05-08 DIAGNOSIS — C50811 Malignant neoplasm of overlapping sites of right female breast: Secondary | ICD-10-CM

## 2018-05-08 DIAGNOSIS — Z170 Estrogen receptor positive status [ER+]: Principal | ICD-10-CM

## 2018-05-08 HISTORY — DX: Personal history of irradiation: Z92.3

## 2018-12-28 ENCOUNTER — Telehealth: Payer: Self-pay | Admitting: Hematology and Oncology

## 2018-12-28 ENCOUNTER — Other Ambulatory Visit: Payer: Self-pay | Admitting: Hematology and Oncology

## 2018-12-28 DIAGNOSIS — C50312 Malignant neoplasm of lower-inner quadrant of left female breast: Secondary | ICD-10-CM

## 2018-12-28 NOTE — Assessment & Plan Note (Signed)
05/13/2013:Right breast biopsy: Invasive mammary cancer, stage Ic, T1 cN0, ER positive PR positive HER-2 negative, status post right mastectomy, Oncotype DX score 16: 10% RRR with tamoxifen, adjuvant radiation therapy ([facility]- seen by Dr.Choksi)  Patient took letrozole from April 2015 to April 2017.  She stopped taking it because of stress regarding her husband's health issues as well as cost of the drug.  Changed provider because she missed too many appointments at the [facility] cancer center and then no longer willing to see her.  Counseling: I discussed with her the importance of keeping her appointments.  She is worried about breast cancer recurrence.  Plan: Mammogram 05/08/2018: Benign, breast density category B I renewed her prescription for letrozole I encouraged her to exercise regularly.  Return to clinic once a year for follow-up.  

## 2018-12-28 NOTE — Telephone Encounter (Signed)
I left a message regarding visit °

## 2018-12-30 NOTE — Progress Notes (Signed)
  HEMATOLOGY-ONCOLOGY TELEPHONE VISIT PROGRESS NOTE  I connected with Gabriella [name] on 12/31/2018 at  1:45 PM EDT by telephone and verified that I am speaking with the correct person using two identifiers.  I discussed the limitations, risks, security and privacy concerns of performing an evaluation and management service by telephone and the availability of in person appointments.  I also discussed with the patient that there may be a patient responsible charge related to this service. The patient expressed understanding and agreed to proceed.   History of Present Illness: Gabriella [name] is a 73 y.o. female with above-mentioned history of right breast cancer treated with lumpectomy, radiation, and who is currently on letrozole. I last saw her a year ago. Mammogram on 05/08/18 showed no evidence of malignancy bilaterally. She is over the phone today for annual follow-up.   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 Stage IC (T1 C. N0 M0 tumor) Estrogen receptor positive.  Progesterone receptor positive.  HER-2/neu receptor negative by FISH. 2. Status post right partial Mastectomy.   3. Oncotype DX score is 16 week chances of recurrent disease in 10 years is 10% with tamoxifen 4. Patient will start radiation therapy followed by an aromatase inhibitor. 5. 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  Restarted July 2019     Observations/Objective:  No clinical evidence of disease recurrence    Assessment Plan:  Carcinoma of overlapping sites of right breast in female, estrogen receptor positive ([place]) 05/13/2013:Right breast biopsy: Invasive mammary cancer, stage Ic, T1 cN0, ER positive PR positive HER-2 negative, status post right mastectomy, Oncotype DX score 16: 10% RRR with tamoxifen, adjuvant radiation therapy [facility]- seen by Dr.Choksi)  Patient took letrozole from April 2015 to April 2017.  She stopped taking it because of stress regarding her husband's health issues as well as cost of the drug.  Changed provider because she missed too many appointments at the [place] [facility] and then no longer willing to see her.  Counseling: I discussed with her the importance of keeping her appointments.  She is worried about breast cancer recurrence.  Plan: Mammogram 05/08/2018: Benign, breast density category B I renewed her prescription for letrozole I encouraged her to exercise regularly.  Return to clinic once a year for follow-up.  I discussed the assessment and treatment plan with the patient. The patient was provided an opportunity to ask questions and all were answered. The patient agreed with the plan and demonstrated an understanding of the instructions. The patient was advised to call back or seek an in-person evaluation if the symptoms worsen or if the condition fails to improve as anticipated.   I provided 15 minutes of non-face-to-face time during this encounter.   Rulon Eisenmenger, MD 12/31/2018    I, Molly Dorshimer, am acting as scribe for Nicholas Lose, MD.  I have reviewed the above documentation for accuracy and completeness, and I agree with the above.

## 2018-12-31 ENCOUNTER — Inpatient Hospital Stay: Payer: Medicare Other | Attending: Hematology and Oncology | Admitting: Hematology and Oncology

## 2018-12-31 DIAGNOSIS — Z170 Estrogen receptor positive status [ER+]: Secondary | ICD-10-CM

## 2018-12-31 DIAGNOSIS — C50811 Malignant neoplasm of overlapping sites of right female breast: Secondary | ICD-10-CM

## 2018-12-31 DIAGNOSIS — C50312 Malignant neoplasm of lower-inner quadrant of left female breast: Secondary | ICD-10-CM

## 2018-12-31 MED ORDER — LETROZOLE 2.5 MG PO TABS: 2.5000 mg | ORAL_TABLET | Freq: Every day | ORAL | 3 refills | Status: DC

## 2019-06-23 ENCOUNTER — Other Ambulatory Visit: Payer: Self-pay | Admitting: Internal Medicine

## 2019-06-23 DIAGNOSIS — J90 Pleural effusion, not elsewhere classified: Secondary | ICD-10-CM

## 2019-07-07 ENCOUNTER — Ambulatory Visit: Admission: RE | Admit: 2019-07-07 | Payer: Medicare Other | Source: Ambulatory Visit

## 2019-07-09 ENCOUNTER — Other Ambulatory Visit: Payer: Self-pay

## 2019-07-09 ENCOUNTER — Ambulatory Visit
Admission: RE | Admit: 2019-07-09 | Discharge: 2019-07-09 | Disposition: A | Discharge status: Home / Self Care | Payer: Medicare Other | Source: Ambulatory Visit | Attending: Internal Medicine | Admitting: Internal Medicine

## 2019-07-09 DIAGNOSIS — J90 Pleural effusion, not elsewhere classified: Secondary | ICD-10-CM | POA: Diagnosis present

## 2019-07-09 LAB — POCT I-STAT CREATININE: Creatinine, Ser: 0.6 mg/dL (ref 0.44–1.00)

## 2019-07-09 MED ORDER — IOHEXOL 300 MG/ML  SOLN
75.0000 mL | Freq: Once | INTRAMUSCULAR | Status: AC | PRN
  Administered 2019-07-09: 75 mL via INTRAVENOUS

## 2019-12-29 ENCOUNTER — Telehealth: Payer: Self-pay | Admitting: Hematology and Oncology

## 2019-12-29 NOTE — Telephone Encounter (Signed)
Called pt per 7/7 schmsg - no answer . Left message for patient to call back to reschedule.

## 2019-12-30 ENCOUNTER — Telehealth: Payer: Self-pay | Admitting: Hematology and Oncology

## 2019-12-30 NOTE — Telephone Encounter (Signed)
Called pt per 7/8 sch msg - no answer. Left message for patient to call back if reschedule needed .

## 2019-12-31 ENCOUNTER — Inpatient Hospital Stay: Payer: Medicare Other | Admitting: Hematology and Oncology

## 2020-02-01 ENCOUNTER — Other Ambulatory Visit: Payer: Self-pay | Admitting: Hematology and Oncology

## 2020-02-01 DIAGNOSIS — C50312 Malignant neoplasm of lower-inner quadrant of left female breast: Secondary | ICD-10-CM

## 2020-02-07 ENCOUNTER — Inpatient Hospital Stay: Payer: Medicare Other | Admitting: Hematology and Oncology

## 2020-02-21 ENCOUNTER — Other Ambulatory Visit: Payer: Self-pay | Admitting: Pulmonary Disease

## 2020-02-21 DIAGNOSIS — R0609 Other forms of dyspnea: Secondary | ICD-10-CM

## 2020-02-21 DIAGNOSIS — J90 Pleural effusion, not elsewhere classified: Secondary | ICD-10-CM

## 2020-02-22 ENCOUNTER — Other Ambulatory Visit: Payer: Self-pay

## 2020-02-22 ENCOUNTER — Other Ambulatory Visit
Admission: RE | Admit: 2020-02-22 | Discharge: 2020-02-22 | Disposition: A | Discharge status: Home / Self Care | Payer: Medicare Other | Source: Ambulatory Visit | Attending: Pulmonary Disease | Admitting: Pulmonary Disease

## 2020-02-22 DIAGNOSIS — Z01812 Encounter for preprocedural laboratory examination: Secondary | ICD-10-CM | POA: Diagnosis present

## 2020-02-22 DIAGNOSIS — Z20822 Contact with and (suspected) exposure to covid-19: Secondary | ICD-10-CM | POA: Insufficient documentation

## 2020-02-22 LAB — SARS CORONAVIRUS 2 (TAT 6-24 HRS): SARS Coronavirus 2: NEGATIVE

## 2020-02-23 ENCOUNTER — Ambulatory Visit
Admission: RE | Admit: 2020-02-23 | Discharge: 2020-02-23 | Disposition: A | Discharge status: Home / Self Care | Payer: Medicare Other | Source: Ambulatory Visit | Attending: Pulmonary Disease | Admitting: Pulmonary Disease

## 2020-02-23 ENCOUNTER — Other Ambulatory Visit: Payer: Self-pay | Admitting: Interventional Radiology

## 2020-02-23 ENCOUNTER — Other Ambulatory Visit: Payer: Self-pay | Admitting: Pulmonary Disease

## 2020-02-23 DIAGNOSIS — R0600 Dyspnea, unspecified: Secondary | ICD-10-CM | POA: Diagnosis present

## 2020-02-23 DIAGNOSIS — J90 Pleural effusion, not elsewhere classified: Secondary | ICD-10-CM

## 2020-02-23 DIAGNOSIS — R0609 Other forms of dyspnea: Secondary | ICD-10-CM

## 2020-02-23 DIAGNOSIS — Z9889 Other specified postprocedural states: Secondary | ICD-10-CM

## 2020-02-28 ENCOUNTER — Other Ambulatory Visit: Payer: Self-pay | Admitting: Hematology and Oncology

## 2020-02-28 DIAGNOSIS — C50312 Malignant neoplasm of lower-inner quadrant of left female breast: Secondary | ICD-10-CM

## 2020-03-06 ENCOUNTER — Telehealth: Payer: Self-pay | Admitting: Hematology and Oncology

## 2020-03-06 NOTE — Telephone Encounter (Signed)
Called pt to r/s appt per 9/13 sch msg - left message for patient to call back and reschedule .

## 2020-03-07 ENCOUNTER — Inpatient Hospital Stay: Payer: Medicare Other | Admitting: Hematology and Oncology

## 2020-04-05 ENCOUNTER — Telehealth: Payer: Self-pay | Admitting: Hematology and Oncology

## 2020-04-05 ENCOUNTER — Inpatient Hospital Stay: Payer: Medicare Other | Admitting: Hematology and Oncology

## 2020-04-05 NOTE — Telephone Encounter (Signed)
Called pt per 10/12 sch msg - no answer. Left message for patient to call back to reschedule appt.

## 2020-05-29 ENCOUNTER — Other Ambulatory Visit: Payer: Self-pay | Admitting: Hematology and Oncology

## 2020-05-29 DIAGNOSIS — Z170 Estrogen receptor positive status [ER+]: Secondary | ICD-10-CM

## 2020-05-29 DIAGNOSIS — C50312 Malignant neoplasm of lower-inner quadrant of left female breast: Secondary | ICD-10-CM

## 2020-06-23 ENCOUNTER — Other Ambulatory Visit: Payer: Self-pay | Admitting: Hematology and Oncology

## 2020-06-23 DIAGNOSIS — C50312 Malignant neoplasm of lower-inner quadrant of left female breast: Secondary | ICD-10-CM

## 2020-06-23 DIAGNOSIS — Z170 Estrogen receptor positive status [ER+]: Secondary | ICD-10-CM

## 2020-07-11 NOTE — Assessment & Plan Note (Deleted)
05/13/2013:Right breast biopsy: Invasive mammary cancer, stage Ic, T1 cN0, ER positive PR positive HER-2 negative, status post right mastectomy, Oncotype DX score 16: 10% RRR with tamoxifen, adjuvant radiation therapy [facility]- seen by Dr.Choksi)  Patient took letrozole from April 2015to April 2017. She stopped taking it because of stress regarding her husband's health issues as well as cost of the drug.  Changed provider because she [facility] then no longer willing to see her.  Counseling: I discussed with her the importance of keeping her appointments. She is worried about breast cancer recurrence.  Plan: Mammogram 05/08/2018: Benign, breast density category B She has not had her yearly mammograms  I renewed her prescription for letrozole I encouraged her to exercise regularly.  Return to clinic once a year for follow-up.

## 2020-07-11 NOTE — Progress Notes (Incomplete)
Patient Care Team: Lauro Regulus, MD as PCP - General (Internal Medicine)  DIAGNOSIS:    ICD-10-CM   1. Carcinoma of overlapping sites of right breast in female, estrogen receptor positive ([facility])  C50.811    Z17.0     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  Progesterone receptor positive.  HER-2/neu receptor negative by FISH. 2. Status post right partial Mastectomy.   3. Oncotype DX score is 16 week chances of recurrent disease in 10 years is 10% with tamoxifen 4. Patient will start radiation therapy followed by an aromatase inhibitor. 5. Started Letrozole, October 18, 2013   Carcinoma of overlapping sites of right breast in female, estrogen receptor positive ([facility])  05/13/2013 Initial Biopsy   Right breast biopsy: Invasive mammary cancer, stage Ic, T1 cN0, ER positive PR positive HER-2 negative, status post right mastectomy, Oncotype DX score 16: 10% RRR with tamoxifen, adjuvant radiation therapy [facility]- seen by Dr.Choksi)   10/18/2013 -  Anti-estrogen oral therapy   Letrozole 2.5 mg daily stopped April 2017.  Restarted July 2019     CHIEF COMPLIANT: Follow-up of right breast cancer on letrozole   INTERVAL HISTORY: Gabriella [name] is a 75 y.o. with above-mentioned history of right breast cancer treated with lumpectomy, radiation, and who is currently on letrozole. She presents to the clinic today for annual follow-up.    ALLERGIES:  is allergic to biaxin [clarithromycin], codeine, eggs or egg-derived products, keflex [cephalexin], morphine and related, penicillins, and sulfa antibiotics.  MEDICATIONS:  Current Outpatient Medications  Medication Sig Dispense Refill  . amitriptyline (ELAVIL) 50 MG tablet Take 1 tablet (50 mg total) by mouth at bedtime. 30 tablet 0  . letrozole (FEMARA) 2.5 MG tablet TAKE  ONE TABLET BY MOUTH DAILY 90 tablet 0  . metFORMIN (GLUCOPHAGE) 500 MG tablet Take 500 mg by mouth 2 (two) times daily with a meal.    . metoprolol tartrate (LOPRESSOR) 25 MG tablet Take 25 mg by mouth daily.    . mometasone (NASONEX) 50 MCG/ACT nasal spray Place 2 sprays into the nose daily.    . pravastatin (PRAVACHOL) 20 MG tablet Take 20 mg by mouth daily.     No current facility-administered medications for this visit.    PHYSICAL EXAMINATION: ECOG PERFORMANCE STATUS: {CHL ONC ECOG [phone]}  There were no vitals filed for this visit. There were no vitals filed for this visit.  BREAST:*** No palpable masses or nodules in either right or left breasts. No palpable axillary supraclavicular or infraclavicular adenopathy no breast tenderness or nipple discharge. (exam performed in the presence of a chaperone)  LABORATORY DATA:  I have reviewed the data as listed CMP Latest Ref Rng & Units 07/09/2019 07/06/2015 01/24/2014  Glucose 65 - 99 mg/dL - 341(M) 106(O)  BUN 6 - 20 mg/dL - 9 13  Creatinine 1.68 - 1.00 mg/dL 1.58 1.00 4.22  Sodium 135 - 145 mmol/L - 133(L) 139  Potassium 3.5 - 5.1 mmol/L - 3.3(L) 3.9  Chloride 101 - 111 mmol/L - 99(L) 102  CO2 22 - 32 mmol/L - 29 30  Calcium 8.9 - 10.3 mg/dL - 9.2 9.0  Total Protein 6.5 - 8.1 g/dL - 7.6 -  Total Bilirubin 0.3 - 1.2 mg/dL - 0.5 -  Alkaline Phos 38 - 126 U/L - 92 -  AST 15 - 41 U/L -  18 -  ALT 14 - 54 U/L - 15 -    Lab Results  Component Value Date   WBC 11.7 (H) 07/06/2015   HGB 12.6 07/06/2015   HCT 37.4 07/06/2015   MCV 86.0 07/06/2015   PLT 348 07/06/2015   NEUTROABS 6.8 (H) 07/06/2015    ASSESSMENT & PLAN:  No problem-specific Assessment & Plan notes found for this encounter.    No orders of the defined types were placed in this encounter.  The patient has a good understanding of the overall plan. she agrees with it. she will call with any problems that may develop before the next visit here.  Total time  spent: *** mins including face to face time and time spent for planning, charting and coordination of care  Nicholas Lose, MD 07/11/2020  I, Cloyde Reams Dorshimer, am acting as scribe for Dr. Nicholas Lose.  {insert scribe attestation}

## 2020-07-12 ENCOUNTER — Inpatient Hospital Stay: Payer: Medicare PPO | Attending: Hematology and Oncology | Admitting: Hematology and Oncology

## 2020-07-12 DIAGNOSIS — C50811 Malignant neoplasm of overlapping sites of right female breast: Secondary | ICD-10-CM

## 2020-07-13 ENCOUNTER — Telehealth: Payer: Self-pay | Admitting: Hematology and Oncology

## 2020-07-13 NOTE — Telephone Encounter (Signed)
Called pt per 1/20 sch msg - no answer. Left message for pt to call back to reschedule appt.

## 2020-07-30 IMAGING — CT CT CHEST W/ CM
2 of 3 series · 15 of 36 positions shown, 18 images · IV contrast (omnipaque)
Comparison: Chest radiograph dated 04/24/2009.

CLINICAL DATA: 74-year-old female with cough and congestion.

EXAM:
CT CHEST WITH CONTRAST
TECHNIQUE: Multidetector CT imaging of the chest was performed during
intravenous contrast administration.
CONTRAST:  75mL OMNIPAQUE IOHEXOL 300 MG/ML  SOLN

[Series 2: axial st · axial · 0.63mm/px · z∈[-538,-258]mm · 12 of 166 slices shown, 15 images]
[im 13/166  mediastinal]
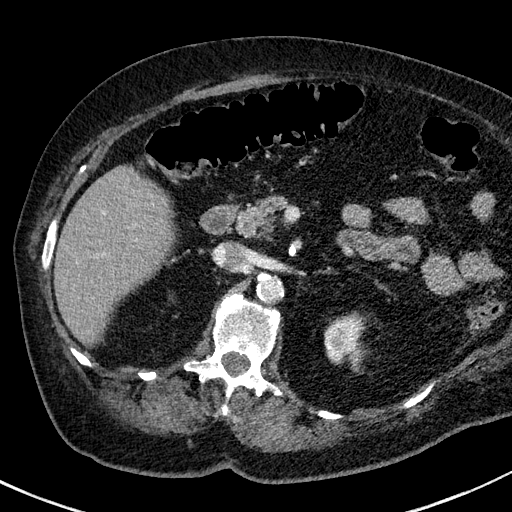
[im 13/166  lung]
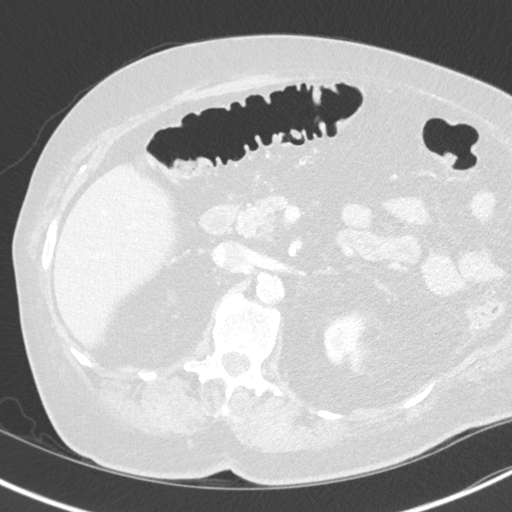
[im 25/166  lung]
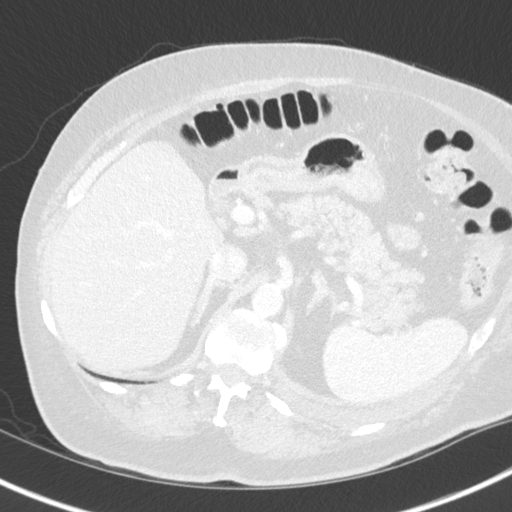
[im 37/166  lung]
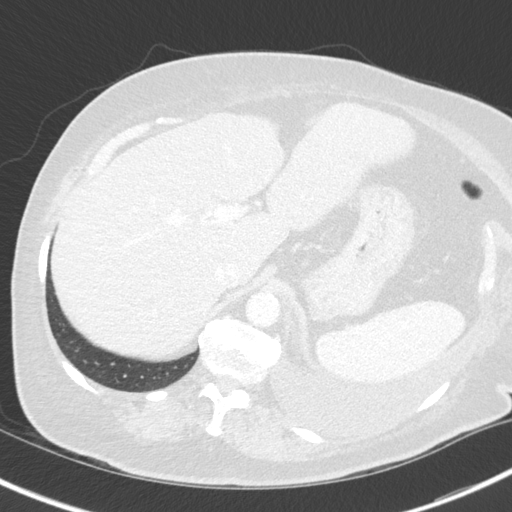
[im 49/166  lung]
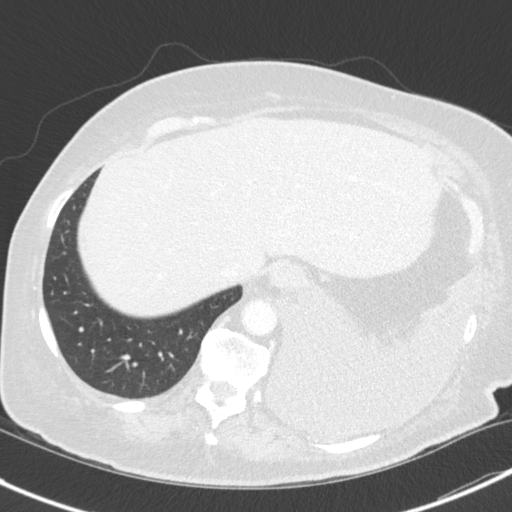
[im 62/166  mediastinal]
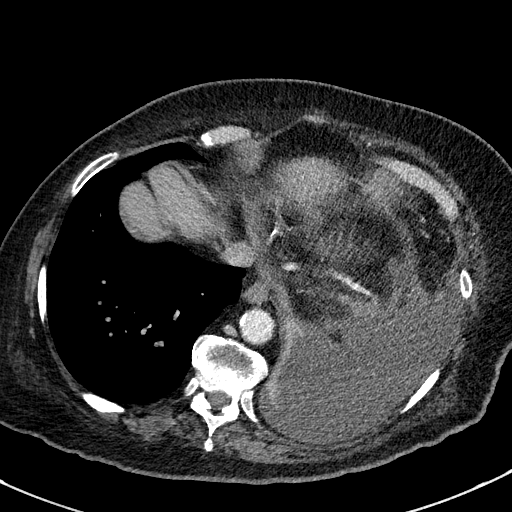
[im 62/166  lung]
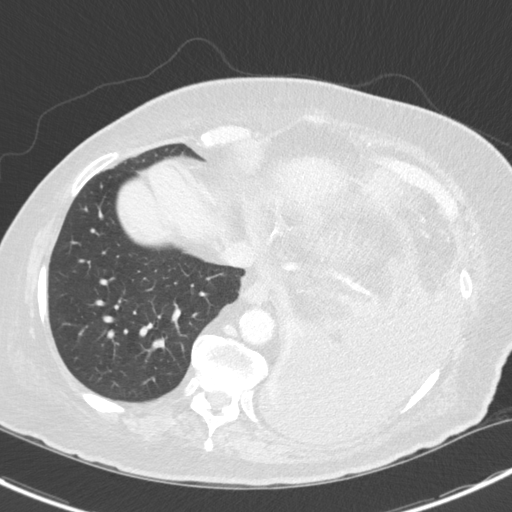
[im 74/166  lung]
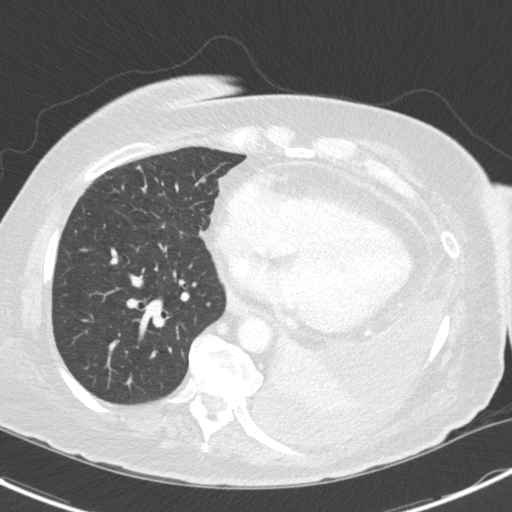
[im 92/166  lung]
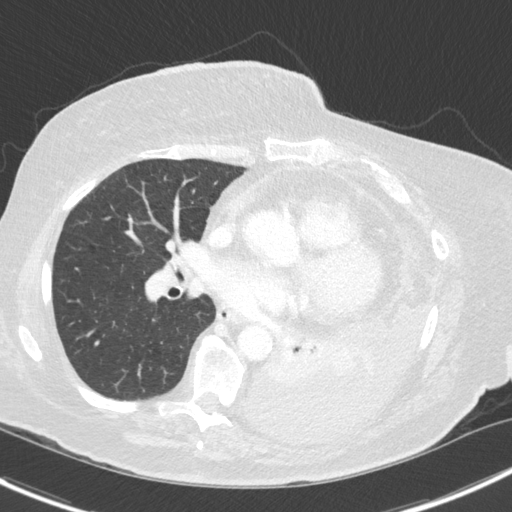
[im 104/166  lung]
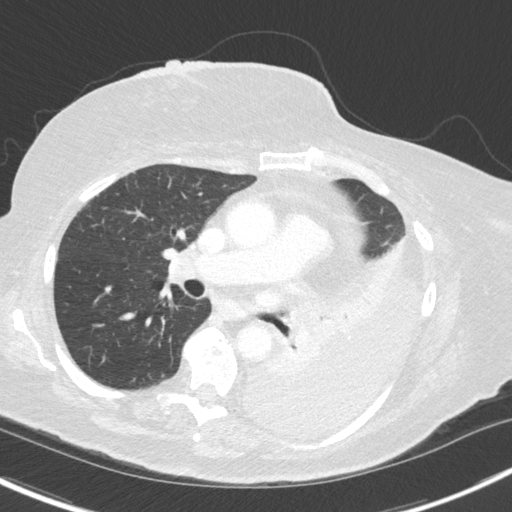
[im 117/166  mediastinal]
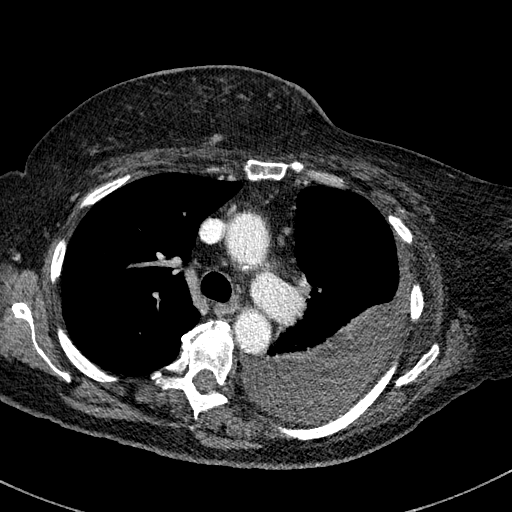
[im 117/166  lung]
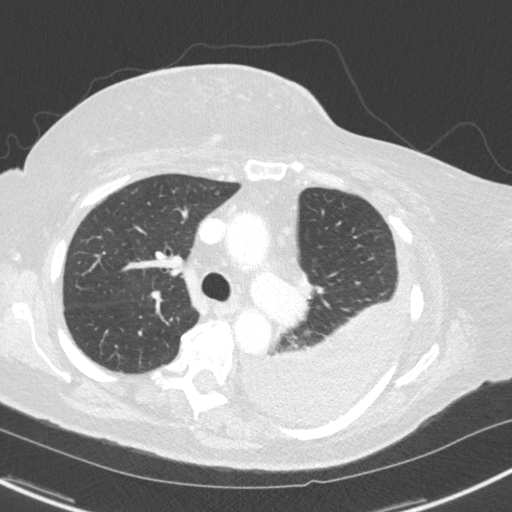
[im 129/166  lung]
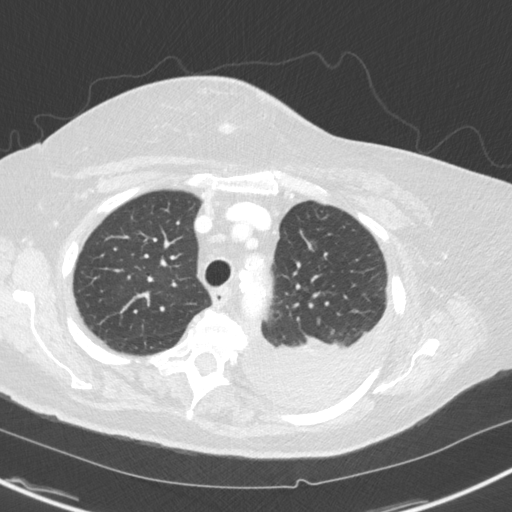
[im 141/166  lung]
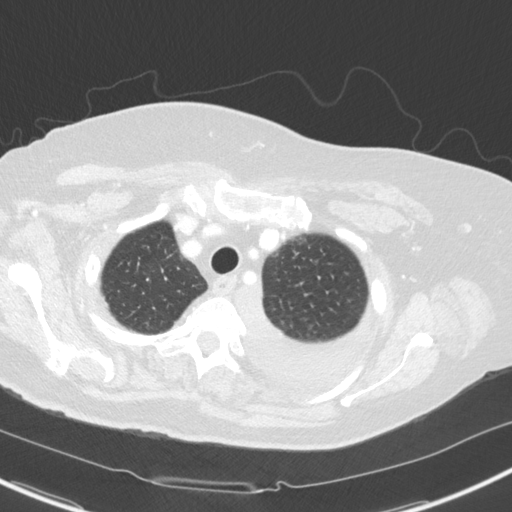
[im 153/166  lung]
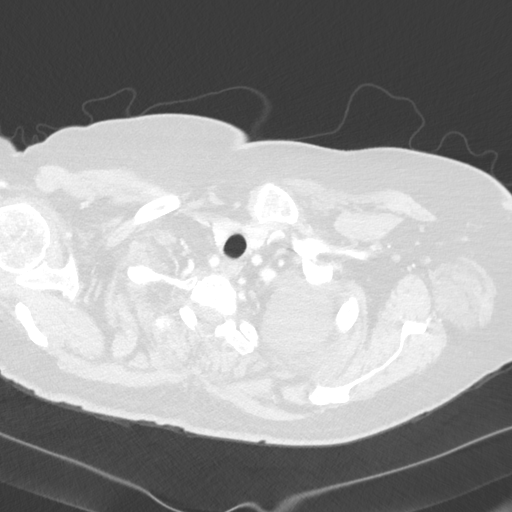

[Series 5: coronal · coronal · 0.64mm/px · 3 of 152 slices shown]
[im 31/152  lung]
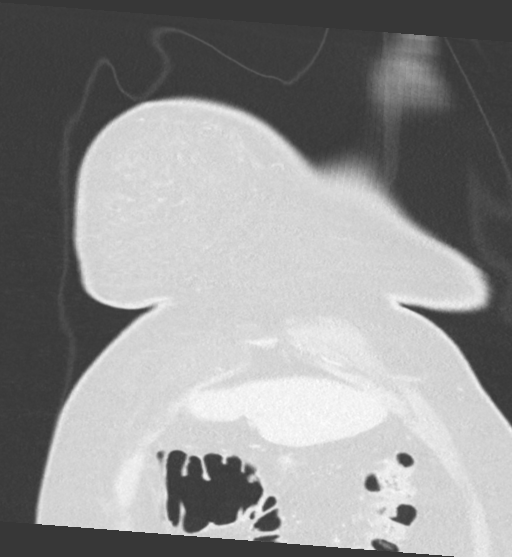
[im 61/152  lung]
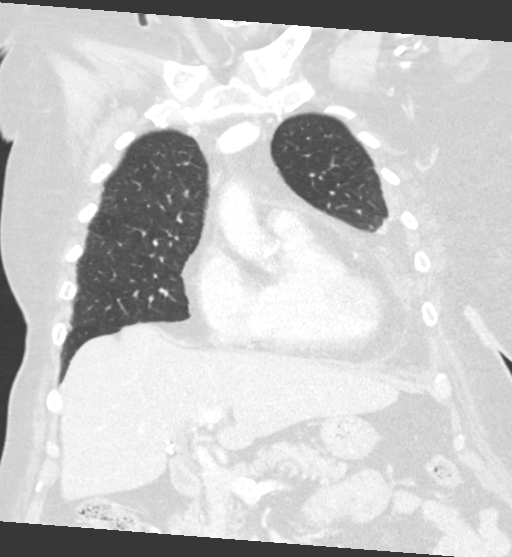
[im 91/152  lung]
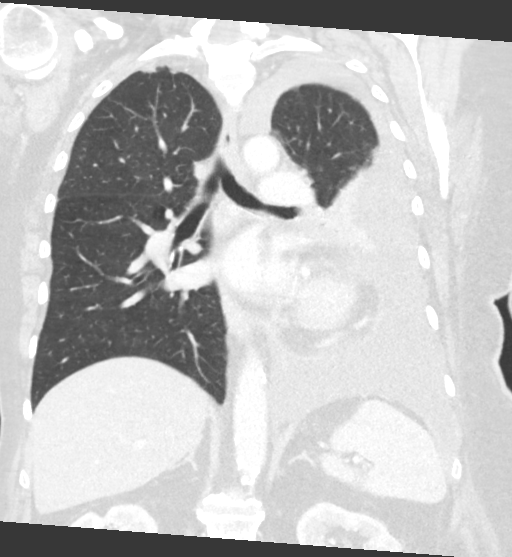

[15 of 36 positions shown; findings below may reference images not displayed]

FINDINGS: Cardiovascular: There is mild cardiomegaly. Coronary vascular
calcification primarily involving the LAD, and RCA. No significant
pericardial effusion. There is moderate atherosclerotic
calcification of the thoracic aorta. No aneurysmal dilatation or
dissection. The origins of the great vessels of the aortic arch
appear patent as visualized. Evaluation of the pulmonary arteries is
limited due to suboptimal opacification and timing of the contrast
as well as suboptimal visualization of the peripheral branches. No
definite large central pulmonary artery embolus identified

Mediastinum/Nodes: No hilar or mediastinal adenopathy. Evaluation of
the left hilar lymph node is limited due to consolidative changes of
the left lung. The esophagus is grossly unremarkable. No mediastinal
fluid collection.

Lungs/Pleura: There is a moderate left pleural effusion. There is
complete consolidative changes of the left lung which may represent
atelectasis although infiltrate is not excluded. Clinical
correlation and follow-up to resolution recommended to exclude an
underlying mass. Overall there is decreased left lung volume with
slight shift of the mediastinum into the left hemithorax. The right
lung is clear. There is no pneumothorax. The central airways are
patent.

Upper Abdomen: Probable fatty infiltration of the liver.
Cholecystectomy. Indeterminate hypodense lesion in the left lobe of
the liver (series 2, image 13) concerning for a neoplastic process
or metastatic disease. Further characterization with MRI without and
with contrast on a nonemergent/outpatient basis recommended.
Partially visualized 2.8 x 3.0 cm low attenuating lesion lateral to
the right kidney.

Musculoskeletal: Degenerative changes of the spine. No acute osseous
pathology.
IMPRESSION: 1. No definite large central pulmonary artery embolus.
2. Moderate to large left pleural effusion with complete compressive
atelectasis of the left lower lobe. Pneumonia is not excluded
clinical correlation and follow-up to resolution recommended to
exclude underlying mass. Thoracentesis may provide further
diagnostic information as well as symptomatic relief.
3. Mild cardiomegaly and coronary vascular calcification.
4.  Aortic Atherosclerosis ([id number]).
5. Indeterminate hypodense lesion in the left lobe of the liver as
well as the partially visualized low attenuating lesion lateral to
the right kidney. Further characterization with MRI without and with
contrast a nonemergent basis recommended.

## 2020-10-05 ENCOUNTER — Other Ambulatory Visit: Payer: Self-pay | Admitting: Hematology and Oncology

## 2020-10-05 DIAGNOSIS — Z170 Estrogen receptor positive status [ER+]: Secondary | ICD-10-CM

## 2020-10-05 DIAGNOSIS — C50312 Malignant neoplasm of lower-inner quadrant of left female breast: Secondary | ICD-10-CM

## 2020-10-06 ENCOUNTER — Telehealth: Payer: Self-pay | Admitting: Hematology and Oncology

## 2020-10-06 NOTE — Telephone Encounter (Signed)
Scheduled appt per 4/14 sch msg. Called pt, no answer. Left msg with appt date and time.

## 2020-10-20 ENCOUNTER — Telehealth: Payer: Self-pay | Admitting: Hematology and Oncology

## 2020-10-20 NOTE — Telephone Encounter (Signed)
R/s appt per 4/28 sch msg. Called pt, no answer. Left msg with appt date and time.

## 2020-10-24 ENCOUNTER — Telehealth: Payer: Self-pay | Admitting: Hematology and Oncology

## 2020-10-24 NOTE — Telephone Encounter (Signed)
Pt called in to r/s appt. R/s appt per pt request.

## 2020-11-01 ENCOUNTER — Other Ambulatory Visit: Payer: Self-pay | Admitting: Hematology and Oncology

## 2020-11-01 DIAGNOSIS — Z170 Estrogen receptor positive status [ER+]: Secondary | ICD-10-CM

## 2020-11-01 DIAGNOSIS — C50312 Malignant neoplasm of lower-inner quadrant of left female breast: Secondary | ICD-10-CM

## 2020-11-06 ENCOUNTER — Ambulatory Visit: Payer: Medicare PPO | Admitting: Hematology and Oncology

## 2020-11-27 ENCOUNTER — Other Ambulatory Visit: Payer: Self-pay | Admitting: Hematology and Oncology

## 2020-11-27 DIAGNOSIS — C50312 Malignant neoplasm of lower-inner quadrant of left female breast: Secondary | ICD-10-CM

## 2020-11-28 ENCOUNTER — Ambulatory Visit: Payer: Medicare PPO | Admitting: Hematology and Oncology

## 2020-11-28 ENCOUNTER — Telehealth: Payer: Self-pay | Admitting: Hematology and Oncology

## 2020-11-28 NOTE — Telephone Encounter (Signed)
Scheduled appointment per 06/07 sch msg. Patient is aware. 

## 2020-11-30 ENCOUNTER — Inpatient Hospital Stay: Payer: Medicare PPO | Admitting: Hematology and Oncology

## 2020-12-12 ENCOUNTER — Other Ambulatory Visit: Payer: Self-pay | Admitting: *Deleted

## 2020-12-12 ENCOUNTER — Telehealth: Payer: Self-pay | Admitting: Hematology and Oncology

## 2020-12-12 DIAGNOSIS — Z170 Estrogen receptor positive status [ER+]: Secondary | ICD-10-CM

## 2020-12-12 DIAGNOSIS — C50811 Malignant neoplasm of overlapping sites of right female breast: Secondary | ICD-10-CM

## 2020-12-12 NOTE — Telephone Encounter (Signed)
R/s appt per 6/21 sch msg. Pt aware.

## 2020-12-13 ENCOUNTER — Ambulatory Visit: Payer: Medicare PPO | Admitting: Hematology and Oncology

## 2020-12-14 ENCOUNTER — Ambulatory Visit: Payer: Medicare PPO | Admitting: Hematology and Oncology

## 2020-12-15 ENCOUNTER — Other Ambulatory Visit: Payer: Self-pay | Admitting: Hematology and Oncology

## 2020-12-15 DIAGNOSIS — Z1231 Encounter for screening mammogram for malignant neoplasm of breast: Secondary | ICD-10-CM

## 2020-12-19 ENCOUNTER — Telehealth: Payer: Self-pay | Admitting: *Deleted

## 2020-12-19 NOTE — Telephone Encounter (Signed)
Received VM from pt.  Attempt x1 to return call, no answer.  LVM to return call to the office.

## 2020-12-21 ENCOUNTER — Ambulatory Visit: Payer: Medicare PPO | Admitting: Hematology and Oncology

## 2021-01-04 ENCOUNTER — Ambulatory Visit: Payer: Medicare PPO | Admitting: Hematology and Oncology

## 2021-02-06 ENCOUNTER — Ambulatory Visit: Payer: Medicare PPO

## 2021-02-08 ENCOUNTER — Inpatient Hospital Stay: Payer: Medicare PPO | Admitting: Hematology and Oncology

## 2021-02-15 ENCOUNTER — Ambulatory Visit: Payer: Medicare PPO

## 2021-02-22 ENCOUNTER — Inpatient Hospital Stay: Payer: Medicare PPO | Admitting: Hematology and Oncology

## 2021-03-16 IMAGING — US US CHEST/MEDIASTINUM
1 series · 4 of 4 positions shown · non-contrast
Comparison: Chest x-ray dated 02/17/20

CLINICAL DATA: 74-year-old female with suspected left pleural
effusion

EXAM:
CHEST ULTRASOUND

[Series 1: us thoracentesis asp pleural space w/img guide · 4 of 4 slices shown]
[im 1/4]
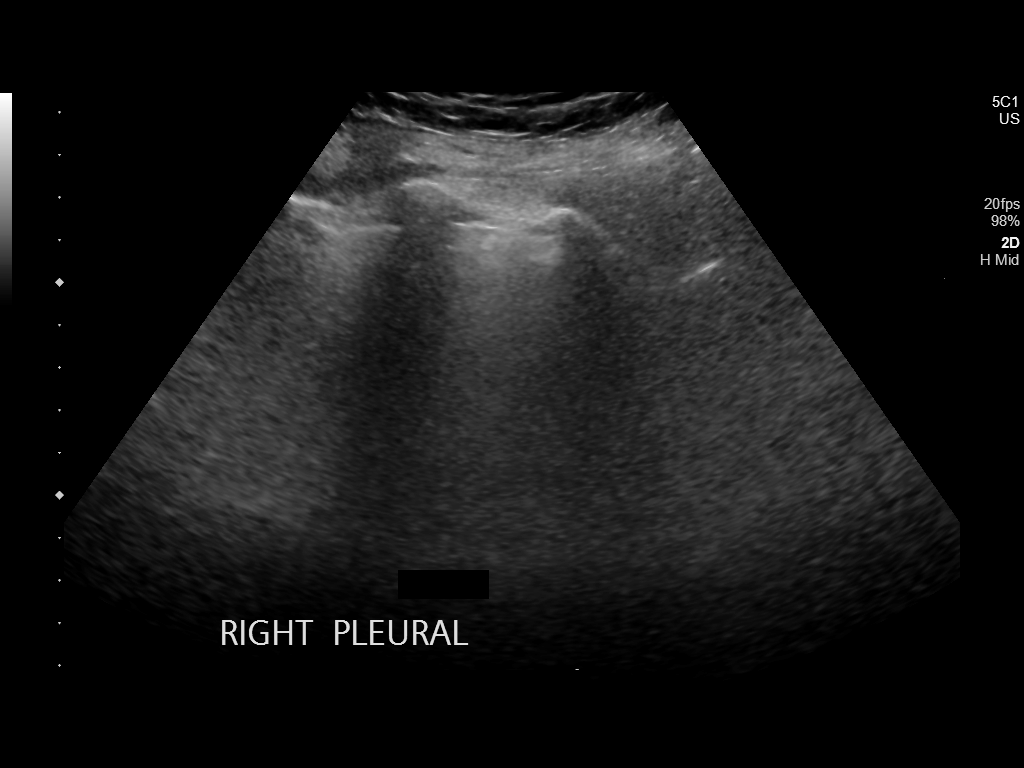
[im 2/4]
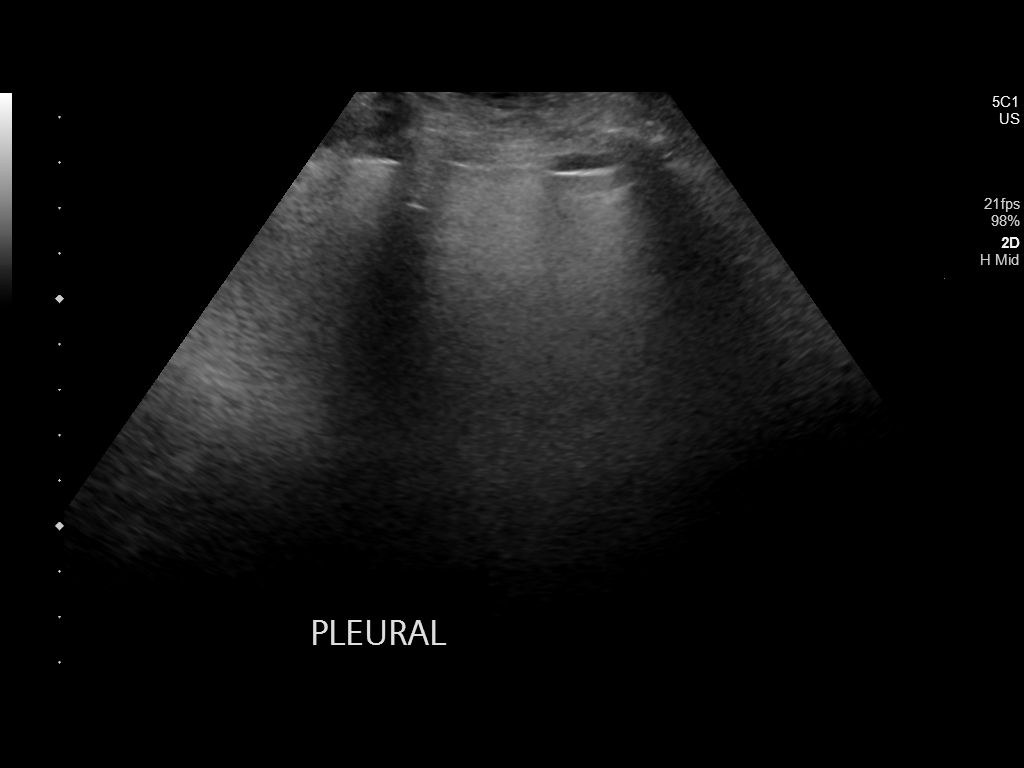
[im 3/4]
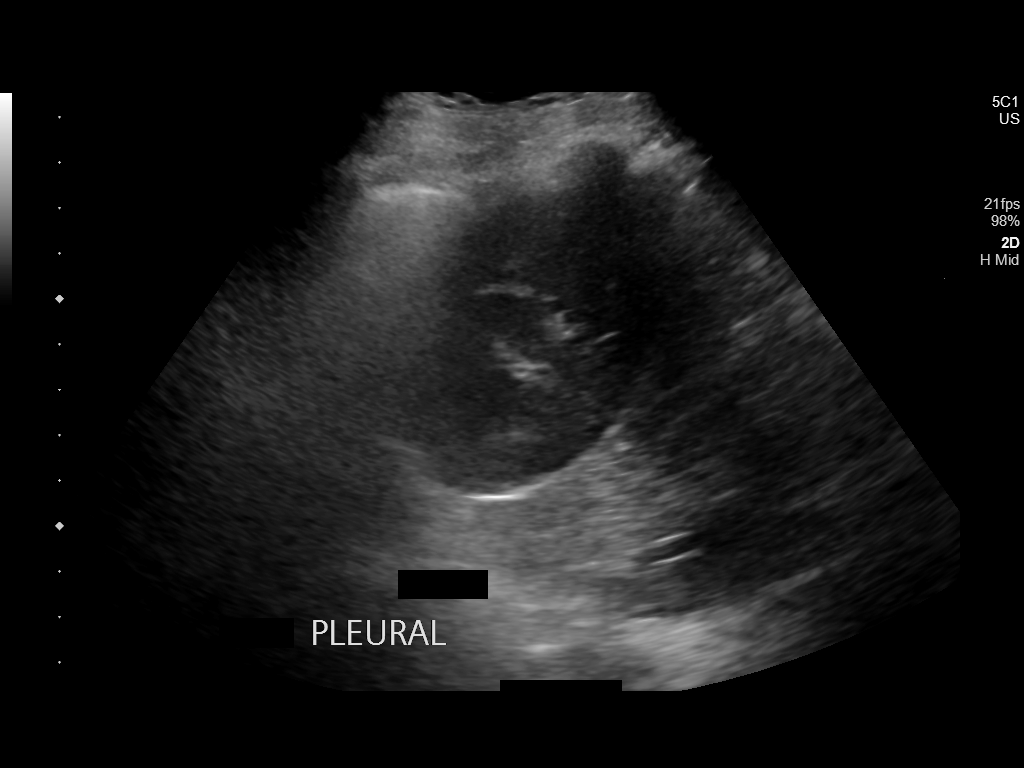
[im 4/4]
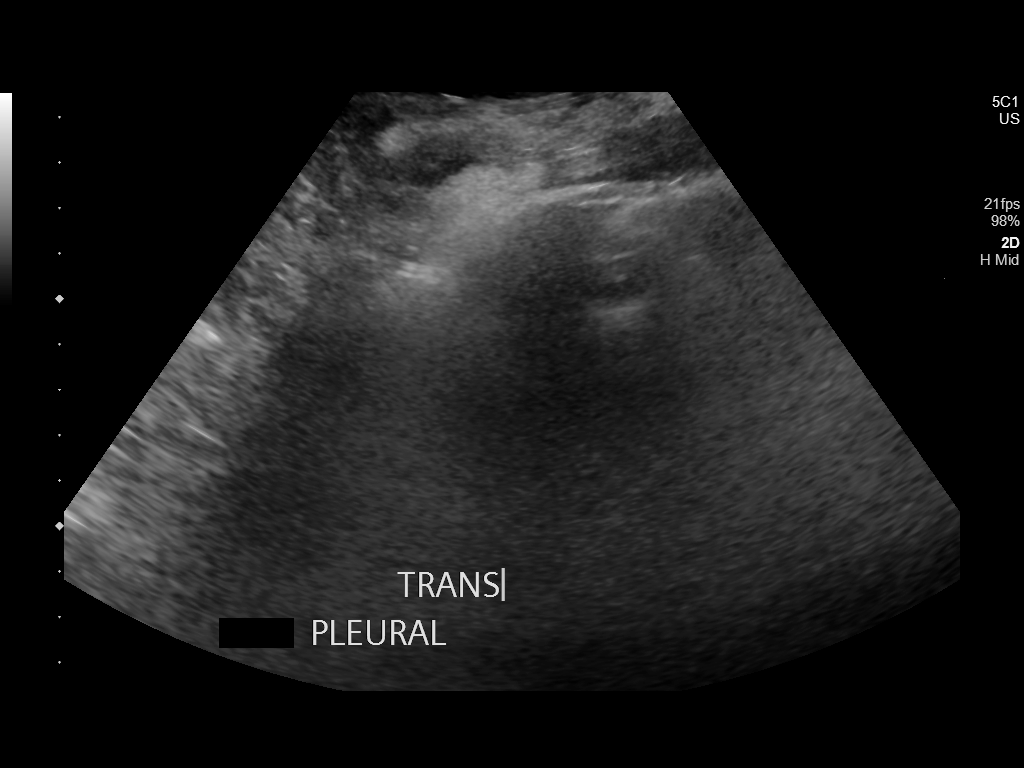

[4 of 4 positions shown; findings below may reference images not displayed]

FINDINGS: Sonographic interrogation of the left chest demonstrates no evidence
of pleural effusion. Correlation with the prior chest x-ray
confirms, there is no evidence of pleural effusion on the prior
chest x-ray.
IMPRESSION: Negative for pleural effusion.

## 2021-03-28 ENCOUNTER — Ambulatory Visit: Payer: Medicare PPO

## 2021-04-02 NOTE — Progress Notes (Signed)
HEMATOLOGY-ONCOLOGY TELEPHONE VISIT PROGRESS NOTE  I connected with Mike Craze on 04/03/2021 at  3:00 PM EDT by telephone and verified that I am speaking with the correct person using two identifiers.  I discussed the limitations, risks, security and privacy concerns of performing an evaluation and management service by telephone and the availability of in person appointments.  I also discussed with the patient that there may be a patient responsible charge related to this service. The patient expressed understanding and agreed to proceed.   History of Present Illness: Gabriella [name] is a 75 y.o. female with above-mentioned history of right breast cancer treated with lumpectomy, radiation, and who is currently on letrozole. I last saw her a year ago. She is over the phone today for annual follow-up.  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 Stage IC (T1 C. N0 M0 tumor) Estrogen receptor positive.  Progesterone receptor positive.  HER-2/neu receptor negative by FISH. 2. Status post right partial Mastectomy.   3. Oncotype DX score is 16 week chances of recurrent disease in 10 years is 10% with tamoxifen 4. Patient will start radiation therapy followed by an aromatase inhibitor. 5. Started Letrozole, October 18, 2013   Carcinoma of overlapping sites of right breast in female, estrogen receptor positive ([place])  05/13/2013 Initial Biopsy   Right breast biopsy: Invasive mammary cancer, stage Ic, T1 cN0, ER positive PR positive HER-2 negative, status post right mastectomy, Oncotype DX score 16: 10% RRR with tamoxifen, adjuvant radiation therapy [facility]- seen by Dr.Choksi)   10/18/2013 -  Anti-estrogen oral therapy   Letrozole 2.5 mg daily stopped April 2017.  Restarted July 2019     Observations/Objective:     Assessment Plan:  Carcinoma of overlapping sites of right breast in female, estrogen receptor positive  ([place]) 05/13/2013:Right breast biopsy: Invasive mammary cancer, stage Ic, T1 cN0, ER positive PR positive HER-2 negative, status post right mastectomy, Oncotype DX score 16: 10% RRR with tamoxifen, adjuvant radiation therapy [facility]- seen by Dr.Choksi)   Patient took letrozole from April 2015 to April 2017.  She stopped taking it because of stress regarding her husband's health issues as well as cost of the drug.   Changed provider because she missed too many appointments at the [facility] and then no longer willing to see her.   Counseling: Since she has not been here for such a long time, we did not renew her letrozole.  She ran out of letrozole 2 months ago.  I renewed it today.   Plan: Mammogram scheduled for 05/02/2021  I encouraged her to keep her appointment for the mammogram.  I renewed her prescription for letrozole     Return to clinic once a year for follow-up.    I discussed the assessment and treatment plan with the patient. The patient was provided an opportunity to ask questions and all were answered. The patient agreed with the plan and demonstrated an understanding of the instructions. The patient was advised to call back or seek an in-person evaluation if the symptoms worsen or if the condition fails to improve as anticipated.   Total time spent: 12 mins including non-face to face time and time spent for planning, charting and coordination of care  Rulon Eisenmenger, MD 04/03/2021    I, Thana Ates, am acting as scribe for Nicholas Lose, MD.  I have reviewed the above documentation for accuracy and completeness, and I  agree with the above.

## 2021-04-03 ENCOUNTER — Inpatient Hospital Stay: Payer: Medicare PPO | Attending: Hematology and Oncology | Admitting: Hematology and Oncology

## 2021-04-03 DIAGNOSIS — Z170 Estrogen receptor positive status [ER+]: Secondary | ICD-10-CM | POA: Diagnosis not present

## 2021-04-03 DIAGNOSIS — C50312 Malignant neoplasm of lower-inner quadrant of left female breast: Secondary | ICD-10-CM | POA: Diagnosis not present

## 2021-04-03 DIAGNOSIS — C50811 Malignant neoplasm of overlapping sites of right female breast: Secondary | ICD-10-CM | POA: Diagnosis not present

## 2021-04-03 MED ORDER — LETROZOLE 2.5 MG PO TABS: 2.5000 mg | ORAL_TABLET | Freq: Every day | ORAL | 3 refills | Status: DC

## 2021-04-03 NOTE — Assessment & Plan Note (Signed)
05/13/2013:Right breast biopsy: Invasive mammary cancer, stage Ic, T1 cN0, ER positive PR positive HER-2 negative, status post right mastectomy, Oncotype DX score 16: 10% RRR with tamoxifen, adjuvant radiation therapy [facility]- seen by Dr.Choksi)  Patient took letrozole from April 2015to April 2017. She stopped taking it because of stress regarding her husband's health issues as well as cost of the drug.  Changed provider because she [facility] then no longer willing to see her.  Counseling: I discussed with her the importance of keeping her appointments. She is worried about breast cancer recurrence. We have not seen her in 2 and half years.  Plan: Mammogram scheduled for 05/02/2021    I renewed her prescription for letrozole I encouraged her to exercise regularly.  Return to clinic once a year for follow-up.

## 2021-05-02 ENCOUNTER — Ambulatory Visit: Payer: Medicare PPO

## 2021-06-01 ENCOUNTER — Ambulatory Visit: Payer: Medicare PPO

## 2021-07-10 ENCOUNTER — Ambulatory Visit: Payer: Medicare PPO

## 2021-07-24 ENCOUNTER — Ambulatory Visit: Payer: Medicare PPO

## 2021-08-02 ENCOUNTER — Ambulatory Visit: Payer: Medicare PPO

## 2021-09-03 ENCOUNTER — Inpatient Hospital Stay: Admission: RE | Admit: 2021-09-03 | Payer: Medicare PPO | Source: Ambulatory Visit

## 2022-03-29 ENCOUNTER — Other Ambulatory Visit: Payer: Self-pay | Admitting: Hematology and Oncology

## 2022-03-29 DIAGNOSIS — C50312 Malignant neoplasm of lower-inner quadrant of left female breast: Secondary | ICD-10-CM

## 2022-04-02 ENCOUNTER — Other Ambulatory Visit: Payer: Self-pay | Admitting: Hematology and Oncology

## 2022-04-02 DIAGNOSIS — Z1231 Encounter for screening mammogram for malignant neoplasm of breast: Secondary | ICD-10-CM

## 2022-04-04 ENCOUNTER — Telehealth: Payer: Medicare PPO | Admitting: Hematology and Oncology

## 2022-05-01 ENCOUNTER — Telehealth: Payer: Self-pay | Admitting: Hematology and Oncology

## 2022-05-01 NOTE — Telephone Encounter (Signed)
Called patient per 11/8 in basket. Left voicemail regarding new appointment time.

## 2022-05-02 ENCOUNTER — Ambulatory Visit: Payer: Medicare PPO

## 2022-05-06 ENCOUNTER — Telehealth: Payer: Medicare PPO | Admitting: Hematology and Oncology

## 2022-06-06 NOTE — Progress Notes (Incomplete)
HEMATOLOGY-ONCOLOGY TELEPHONE VISIT PROGRESS NOTE  I connected with our patient on 06/06/22 at 11:45 AM EST by telephone and verified that I am speaking with the correct person using two identifiers.  I discussed the limitations, risks, security and privacy concerns of performing an evaluation and management service by telephone and the availability of in person appointments.  I also discussed with the patient that there may be a patient responsible charge related to this service. The patient expressed understanding and agreed to proceed.   History of Present Illness: Gabriella [name] is a 76 y.o. female with above-mentioned history of right breast cancer treated with lumpectomy, radiation, and who is currently on letrozole. She presents to the clinic for a telephone follow-up.  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 Stage IC (T1 C. N0 M0 tumor) Estrogen receptor positive.  Progesterone receptor positive.  HER-2/neu receptor negative by FISH. 2. Status post right partial Mastectomy.   3. Oncotype DX score is 16 week chances of recurrent disease in 10 years is 10% with tamoxifen 4. Patient will start radiation therapy followed by an aromatase inhibitor. 5. Started Letrozole, October 18, 2013   Carcinoma of overlapping sites of right breast in female, estrogen receptor positive ([place])  05/13/2013 Initial Biopsy   Right breast biopsy: Invasive mammary cancer, stage Ic, T1 cN0, ER positive PR positive HER-2 negative, status post right mastectomy, Oncotype DX score 16: 10% RRR with tamoxifen, adjuvant radiation therapy [facility]- seen by Dr.Choksi)   10/18/2013 -  Anti-estrogen oral therapy   Letrozole 2.5 mg daily stopped April 2017.  Restarted July 2019     REVIEW OF SYSTEMS:   Constitutional: Denies fevers, chills or abnormal weight loss All other systems were reviewed with the patient and are  negative. Observations/Objective:     Assessment Plan:  No problem-specific Assessment & Plan notes found for this encounter.    I discussed the assessment and treatment plan with the patient. The patient was provided an opportunity to ask questions and all were answered. The patient agreed with the plan and demonstrated an understanding of the instructions. The patient was advised to call back or seek an in-person evaluation if the symptoms worsen or if the condition fails to improve as anticipated.   I provided *** minutes of non-face-to-face time during this encounter.  This includes time for charting and coordination of care   [place], CMA  I Gabriella [name], Gabriella [name] am acting as a Education administrator for Dr.Vinay [employer]  ***

## 2022-06-07 ENCOUNTER — Telehealth: Payer: Self-pay | Admitting: Hematology and Oncology

## 2022-06-07 ENCOUNTER — Inpatient Hospital Stay: Payer: Medicare PPO | Admitting: Hematology and Oncology

## 2022-06-07 NOTE — Telephone Encounter (Signed)
Patient's friend called to informed us that patient has covid and unable to speak/hear. R/s phone visit for later date.

## 2022-06-07 NOTE — Assessment & Plan Note (Signed)
05/13/2013:Right breast biopsy: Invasive mammary cancer, stage Ic, T1 cN0, ER positive PR positive HER-2 negative, status post right mastectomy, Oncotype DX score 16: 10% RRR with tamoxifen, adjuvant radiation therapy [facility]- seen by Dr.Choksi)   Patient took letrozole from April 2015 to April 2017.  She stopped taking it because of stress regarding her husband's health issues as well as cost of the drug.   Changed provider because she missed too many appointments at the [place] [facility] and then no longer willing to see her.   Resumed letrozole 04/03/2021   Breast cancer surveillance:  Mammogram scheduled for 06/26/2022 Breast exam 06/07/2022: Benign I encouraged her to keep her appointment for the mammogram.   I renewed her prescription for letrozole.    Return to clinic once a year for follow-up.

## 2022-06-10 NOTE — Progress Notes (Signed)
This encounter was created in error - please disregard.

## 2022-06-24 NOTE — Progress Notes (Deleted)
.  vg

## 2022-06-26 ENCOUNTER — Ambulatory Visit: Payer: Medicare PPO

## 2022-06-26 ENCOUNTER — Telehealth: Payer: Self-pay | Admitting: Hematology and Oncology

## 2022-06-26 NOTE — Telephone Encounter (Signed)
Patient called to r/s appointment due to covid. Pushed out appointment for patient. May call back after mammogram is scheduled.

## 2022-06-28 ENCOUNTER — Inpatient Hospital Stay: Payer: Medicare PPO | Admitting: Hematology and Oncology

## 2022-07-26 ENCOUNTER — Inpatient Hospital Stay: Payer: Medicare PPO | Admitting: Hematology and Oncology

## 2022-08-14 ENCOUNTER — Telehealth: Payer: Self-pay | Admitting: *Deleted

## 2022-08-14 NOTE — Telephone Encounter (Signed)
Received VM from pt requesting to reschedule MD visit to be after upcoming mammogram.  Appt rescheduled.  RN attempt x1 to return call.  No answer, LVM with update appt details.

## 2022-08-22 ENCOUNTER — Inpatient Hospital Stay: Payer: Medicare PPO | Admitting: Hematology and Oncology

## 2022-08-22 ENCOUNTER — Ambulatory Visit: Payer: Medicare PPO

## 2022-08-26 ENCOUNTER — Ambulatory Visit: Payer: Medicare PPO

## 2022-08-29 ENCOUNTER — Ambulatory Visit
Admission: RE | Admit: 2022-08-29 | Discharge: 2022-08-29 | Disposition: A | Discharge status: Home / Self Care | Payer: Medicare PPO | Source: Ambulatory Visit | Attending: Hematology and Oncology | Admitting: Hematology and Oncology

## 2022-08-29 DIAGNOSIS — Z1231 Encounter for screening mammogram for malignant neoplasm of breast: Secondary | ICD-10-CM | POA: Diagnosis present

## 2022-08-30 NOTE — Progress Notes (Signed)
HEMATOLOGY-ONCOLOGY TELEPHONE VISIT PROGRESS NOTE  I connected with our patient on 09/03/22 at  9:45 AM EDT by telephone and verified that I am speaking with the correct person using two identifiers.  I discussed the limitations, risks, security and privacy concerns of performing an evaluation and management service by telephone and the availability of in person appointments.  I also discussed with the patient that there may be a patient responsible charge related to this service. The patient expressed understanding and agreed to proceed.   History of Present Illness: Gabriella [name] is a 77 y.o. female with above-mentioned history of right breast cancer treated with lumpectomy, radiation, and who is currently on letrozole. She presents to the clinic for a telephone follow-up.  She reports no new problems or concerns.  She is tolerating letrozole extremely well.   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 Stage IC (T1 C. N0 M0 tumor) Estrogen receptor positive.  Progesterone receptor positive.  HER-2/neu receptor negative by FISH. 2. Status post right partial Mastectomy.   3. Oncotype DX score is 16 week chances of recurrent disease in 10 years is 10% with tamoxifen 4. Patient will start radiation therapy followed by an aromatase inhibitor. 5. Started Letrozole, October 18, 2013   Carcinoma of overlapping sites of right breast in female, estrogen receptor positive ([place])  05/13/2013 Initial Biopsy   Right breast biopsy: Invasive mammary cancer, stage Ic, T1 cN0, ER positive PR positive HER-2 negative, status post right mastectomy, Oncotype DX score 16: 10% RRR with tamoxifen, adjuvant radiation therapy [facility]- seen by Dr.Choksi)   10/18/2013 -  Anti-estrogen oral therapy   Letrozole 2.5 mg daily stopped April 2017.  Restarted July 2019     REVIEW OF SYSTEMS:   Constitutional: Denies fevers, chills or abnormal  weight loss All other systems were reviewed with the patient and are negative. Observations/Objective:     Assessment Plan:  Carcinoma of overlapping sites of right breast in female, estrogen receptor positive ([place]) 05/13/2013:Right breast biopsy: Invasive mammary cancer, stage Ic, T1 cN0, ER positive PR positive HER-2 negative, status post right mastectomy, Oncotype DX score 16: 10% RRR with tamoxifen, adjuvant radiation therapy [facility]- seen by Dr.Choksi)   Patient took letrozole from April 2015 to April 2017.  She stopped taking it because of stress regarding her husband's health issues as well as cost of the drug.   Changed provider because she missed too many appointments at the [place] center and then no longer willing to see her.   Breast cancer surveillance:   Mammogram 09/02/2022: Benign breast density category B   Return to clinic once a year for follow-up.  I discussed the assessment and treatment plan with the patient. The patient was provided an opportunity to ask questions and all were answered. The patient agreed with the plan and demonstrated an understanding of the instructions. The patient was advised to call back or seek an in-person evaluation if the symptoms worsen or if the condition fails to improve as anticipated.   I provided 12 minutes of non-face-to-face time during this encounter.  This includes time for charting and coordination of care   Harriette Ohara, MD  I Gardiner Coins am acting as a scribe for Dr.Vinay Gudena  I have reviewed the above documentation for accuracy and completeness, and I agree with the above.

## 2022-09-03 ENCOUNTER — Inpatient Hospital Stay: Payer: Medicare PPO | Attending: Hematology and Oncology | Admitting: Hematology and Oncology

## 2022-09-03 DIAGNOSIS — Z170 Estrogen receptor positive status [ER+]: Secondary | ICD-10-CM | POA: Diagnosis not present

## 2022-09-03 DIAGNOSIS — C50811 Malignant neoplasm of overlapping sites of right female breast: Secondary | ICD-10-CM | POA: Diagnosis not present

## 2022-09-03 NOTE — Assessment & Plan Note (Signed)
05/13/2013:Right breast biopsy: Invasive mammary cancer, stage Ic, T1 cN0, ER positive PR positive HER-2 negative, status post right mastectomy, Oncotype DX score 16: 10% RRR with tamoxifen, adjuvant radiation therapy [facility]- seen by Dr.Choksi)   Patient took letrozole from April 2015 to April 2017.  She stopped taking it because of stress regarding her husband's health issues as well as cost of the drug.   Changed provider because she missed too many appointments at the [place] [facility] and then no longer willing to see her.   Breast cancer surveillance:   Mammogram 09/02/2022: Benign breast density category B Breast exam 09/03/2022: Benign   I renewed her prescription for letrozole.    Return to clinic once a year for follow-up.

## 2022-09-04 ENCOUNTER — Telehealth: Payer: Self-pay | Admitting: Hematology and Oncology

## 2022-09-04 NOTE — Telephone Encounter (Signed)
Scheduled appointment per 3/12 los. Left voicemail for the patients daughter Stanton Kidney.

## 2023-03-25 ENCOUNTER — Other Ambulatory Visit: Payer: Self-pay | Admitting: Hematology and Oncology

## 2023-03-25 DIAGNOSIS — Z170 Estrogen receptor positive status [ER+]: Secondary | ICD-10-CM

## 2023-10-06 ENCOUNTER — Inpatient Hospital Stay: Payer: Medicare PPO | Attending: Hematology and Oncology | Admitting: Hematology and Oncology

## 2023-10-06 DIAGNOSIS — Z170 Estrogen receptor positive status [ER+]: Secondary | ICD-10-CM | POA: Diagnosis not present

## 2023-10-06 DIAGNOSIS — C50811 Malignant neoplasm of overlapping sites of right female breast: Secondary | ICD-10-CM

## 2023-10-06 NOTE — Progress Notes (Signed)
 HEMATOLOGY-ONCOLOGY TELEPHONE VISIT PROGRESS NOTE  I connected with our patient on 10/06/23 at 10:15 AM EDT by telephone and verified that I am speaking with the correct person using two identifiers.  I discussed the limitations, risks, security and privacy concerns of performing an evaluation and management service by telephone and the availability of in person appointments.  I also discussed with the patient that there may be a patient responsible charge related to this service. The patient expressed understanding and agreed to proceed.   History of Present Illness: Surveillance of breast cancer  History of Present Illness The patient, who is due for a mammogram, has received a notice for the same. She has not yet scheduled the appointment but plans to do so. The patient does not report any new or ongoing symptoms.    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 Stage IC (T1 C. N0 M0 tumor) Estrogen receptor positive.  Progesterone receptor positive.  HER-2/neu receptor negative by FISH. 2. Status post right partial Mastectomy.   3. Oncotype DX score is 16 week chances of recurrent disease in 10 years is 10% with tamoxifen 4. Patient will start radiation therapy followed by an aromatase inhibitor. 5. Started Letrozole, October 18, 2013   Carcinoma of overlapping sites of right breast in female, estrogen receptor positive ([facility])  05/13/2013 Initial Biopsy   Right breast biopsy: Invasive mammary cancer, stage Ic, T1 cN0, ER positive PR positive HER-2 negative, status post right mastectomy, Oncotype DX score 16: 10% RRR with tamoxifen, adjuvant radiation therapy [facility]- seen by Dr.Choksi)   10/18/2013 -  Anti-estrogen oral therapy   Letrozole 2.5 mg daily stopped April 2017.  Restarted July 2019     REVIEW OF SYSTEMS:   Constitutional: Denies fevers, chills or abnormal weight loss All other systems were reviewed  with the patient and are negative. Observations/Objective:     Assessment Plan:  Carcinoma of overlapping sites of right breast in female, estrogen receptor positive ([facility]) 05/13/2013:Right breast biopsy: Invasive mammary cancer, stage Ic, T1 cN0, ER positive PR positive HER-2 negative, status post right mastectomy, Oncotype DX score 16: 10% RRR with tamoxifen, adjuvant radiation therapy [facility]- seen by Dr.Choksi)   Patient took letrozole from April 2015 to April 2017.  She stopped taking it because of stress regarding her husband's health issues as well as cost of the drug.   Changed provider because she missed too many appointments at the [facility] cancer center and then no longer willing to see her.   Breast cancer surveillance:   Mammogram 09/02/2022: Benign breast density category B She will call and make an appointment for a new mammogram.  Return to clinic once a year for follow-up with the telemetry phone visit     I discussed the assessment and treatment plan with the patient. The patient was provided an opportunity to ask questions and all were answered. The patient agreed with the plan and demonstrated an understanding of the instructions. The patient was advised to call back or seek an in-person evaluation if the symptoms worsen or if the condition fails to improve as anticipated.   I provided 20 minutes of non-face-to-face time during this encounter.  This includes time for charting and coordination of care   Tamsen Meek, MD

## 2023-10-06 NOTE — Assessment & Plan Note (Signed)
 05/13/2013:Right breast biopsy: Invasive mammary cancer, stage Ic, T1 cN0, ER positive PR positive HER-2 negative, status post right mastectomy, Oncotype DX score 16: 10% RRR with tamoxifen, adjuvant radiation therapy [facility]- seen by Dr.Choksi)   Patient took letrozole from April 2015 to April 2017.  She stopped taking it because of stress regarding her husband's health issues as well as cost of the drug.   Changed provider because she missed too many appointments at the [facility] and then no longer willing to see her.   Breast cancer surveillance:   Mammogram 09/02/2022: Benign breast density category B Breast exam 10/06/2023: Benign   Return to clinic once a year for follow-up.

## 2023-10-07 ENCOUNTER — Telehealth: Payer: Self-pay | Admitting: Hematology and Oncology

## 2023-10-07 NOTE — Telephone Encounter (Signed)
Called patient received no answer unable to leave vm mailbox full

## 2023-12-03 ENCOUNTER — Other Ambulatory Visit: Payer: Self-pay | Admitting: Hematology and Oncology

## 2023-12-03 DIAGNOSIS — Z1231 Encounter for screening mammogram for malignant neoplasm of breast: Secondary | ICD-10-CM

## 2023-12-08 ENCOUNTER — Telehealth: Admitting: Hematology and Oncology

## 2024-01-01 ENCOUNTER — Ambulatory Visit

## 2024-01-12 ENCOUNTER — Telehealth: Admitting: Hematology and Oncology

## 2024-02-04 ENCOUNTER — Ambulatory Visit

## 2024-02-09 ENCOUNTER — Telehealth: Payer: Self-pay
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
## 2024-02-11 ENCOUNTER — Telehealth: Admitting: Hematology and Oncology

## 2024-03-09 ENCOUNTER — Ambulatory Visit
Admission: RE | Admit: 2024-03-09 | Discharge: 2024-03-09 | Disposition: A | Discharge status: Home / Self Care | Source: Ambulatory Visit | Attending: Hematology and Oncology | Admitting: Hematology and Oncology

## 2024-03-09 DIAGNOSIS — Z1231 Encounter for screening mammogram for malignant neoplasm of breast: Secondary | ICD-10-CM | POA: Insufficient documentation

## 2024-03-16 ENCOUNTER — Inpatient Hospital Stay: Attending: Hematology and Oncology | Admitting: Hematology and Oncology

## 2024-03-16 DIAGNOSIS — C50312 Malignant neoplasm of lower-inner quadrant of left female breast: Secondary | ICD-10-CM

## 2024-03-16 DIAGNOSIS — C50811 Malignant neoplasm of overlapping sites of right female breast: Secondary | ICD-10-CM | POA: Diagnosis not present

## 2024-03-16 DIAGNOSIS — Z170 Estrogen receptor positive status [ER+]: Secondary | ICD-10-CM

## 2024-03-16 MED ORDER — LETROZOLE 2.5 MG PO TABS
2.5000 mg | ORAL_TABLET | Freq: Every day | ORAL | 3 refills | Status: AC
Start: 2024-03-16 — End: ?

## 2024-03-16 NOTE — Assessment & Plan Note (Signed)
 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  She stopped taking it because of stress regarding her husband's health issues as well as cost of the drug.   Changed provider because she missed too many appointments at the [facility] cancer center and then no longer willing to see her.   Breast cancer surveillance:   Mammogram 03/11/2024: Benign breast density category B Breast exam 03/16/2024: Benign   Return to clinic once a year for follow-up

## 2024-03-16 NOTE — Progress Notes (Signed)
 HEMATOLOGY-ONCOLOGY TELEPHONE VISIT PROGRESS NOTE  I connected with our patient on 03/16/24 at  2:45 PM EDT by telephone and verified that I am speaking with the correct person using two identifiers.  I discussed the limitations, risks, security and privacy concerns of performing an evaluation and management service by telephone and the availability of in person appointments.  I also discussed with the patient that there may be a patient responsible charge related to this service. The patient expressed understanding and agreed to proceed.   History of Present Illness: Surveillance of breast cancer  History of Present Illness Gabriella [name] is a 78 year old female who presents for routine follow-up and medication refill.  She is on Letrozole  for breast cancer management and adheres strictly to her medication regimen. A recent mammogram returned normal results, providing reassurance. Her medication refills are managed through [employer] in [place], with sufficient refills for the upcoming year.    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 Stage IC (T1 C. N0 M0 tumor) Estrogen receptor positive.  Progesterone receptor positive.  HER-2/neu receptor negative by FISH. 2. Status post right partial Mastectomy.   3. Oncotype DX score is 16 week chances of recurrent disease in 10 years is 10% with tamoxifen 4. Patient will start radiation therapy followed by an aromatase inhibitor. 5. 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  Restarted July 2019     REVIEW OF SYSTEMS:   Constitutional: Denies fevers, chills or abnormal weight loss All other systems were reviewed with the patient and are negative. 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  She stopped taking it because of stress regarding her husband's health issues as well as cost of the drug.   Changed provider because she missed too many appointments at the [facility] cancer center and then no longer willing to see her.   Breast cancer surveillance:   Mammogram 03/11/2024: Benign breast density category B   Return to clinic once a year for follow-up   I discussed the assessment and treatment plan with the patient. The patient was provided an opportunity to ask questions and all were answered. The patient agreed with the plan and demonstrated an understanding of the instructions. The patient was advised to call back or seek an in-person evaluation if the symptoms worsen or if the condition fails to improve as anticipated.   I provided 20 minutes of non-face-to-face time during this encounter.  This includes time for charting and coordination of care   Naomi MARLA Chad, MD

## 2025-03-17 ENCOUNTER — Telehealth: Admitting: Hematology and Oncology
